# Patient Record
Sex: Female | Born: 1945 | Race: White | Hispanic: No | Marital: Married | Smoking: Never smoker
Health system: Southern US, Community
[De-identification: ages and names within clinical notes are randomized; demographics above are authoritative.]

## PROBLEM LIST (undated history)

## (undated) DIAGNOSIS — I2510 Atherosclerotic heart disease of native coronary artery without angina pectoris: Secondary | ICD-10-CM

## (undated) DIAGNOSIS — M1990 Unspecified osteoarthritis, unspecified site: Secondary | ICD-10-CM

## (undated) DIAGNOSIS — I10 Essential (primary) hypertension: Secondary | ICD-10-CM

## (undated) DIAGNOSIS — I350 Nonrheumatic aortic (valve) stenosis: Secondary | ICD-10-CM

## (undated) DIAGNOSIS — I4891 Unspecified atrial fibrillation: Secondary | ICD-10-CM

## (undated) DIAGNOSIS — I639 Cerebral infarction, unspecified: Secondary | ICD-10-CM

## (undated) DIAGNOSIS — I509 Heart failure, unspecified: Secondary | ICD-10-CM

## (undated) HISTORY — PX: ABDOMINAL HYSTERECTOMY: SHX81

## (undated) HISTORY — PX: CORONARY ARTERY BYPASS GRAFT: SHX141

## (undated) HISTORY — PX: JOINT REPLACEMENT: SHX530

## (undated) HISTORY — PX: BLADDER SURGERY: SHX569

## (undated) HISTORY — PX: CARDIAC CATHETERIZATION: SHX172

---

## 2006-08-30 HISTORY — PX: OTHER SURGICAL HISTORY: SHX169

## 2018-03-06 HISTORY — PX: OTHER SURGICAL HISTORY: SHX169

## 2018-03-20 DIAGNOSIS — Z5189 Encounter for other specified aftercare: Secondary | ICD-10-CM

## 2018-03-20 DIAGNOSIS — J9621 Acute and chronic respiratory failure with hypoxia: Secondary | ICD-10-CM

## 2018-03-20 DIAGNOSIS — I5022 Chronic systolic (congestive) heart failure: Secondary | ICD-10-CM | POA: Diagnosis not present

## 2018-03-20 DIAGNOSIS — I482 Chronic atrial fibrillation: Secondary | ICD-10-CM

## 2018-03-21 DIAGNOSIS — I482 Chronic atrial fibrillation: Secondary | ICD-10-CM

## 2018-03-21 DIAGNOSIS — Z5189 Encounter for other specified aftercare: Secondary | ICD-10-CM | POA: Diagnosis not present

## 2018-03-21 DIAGNOSIS — J9621 Acute and chronic respiratory failure with hypoxia: Secondary | ICD-10-CM

## 2018-03-21 DIAGNOSIS — I5022 Chronic systolic (congestive) heart failure: Secondary | ICD-10-CM | POA: Diagnosis not present

## 2018-03-22 DIAGNOSIS — Z5189 Encounter for other specified aftercare: Secondary | ICD-10-CM

## 2018-03-22 DIAGNOSIS — J9621 Acute and chronic respiratory failure with hypoxia: Secondary | ICD-10-CM

## 2018-03-22 DIAGNOSIS — I5022 Chronic systolic (congestive) heart failure: Secondary | ICD-10-CM

## 2018-03-22 DIAGNOSIS — I482 Chronic atrial fibrillation: Secondary | ICD-10-CM

## 2018-03-23 DIAGNOSIS — Z5189 Encounter for other specified aftercare: Secondary | ICD-10-CM | POA: Diagnosis not present

## 2018-03-23 DIAGNOSIS — I482 Chronic atrial fibrillation: Secondary | ICD-10-CM | POA: Diagnosis not present

## 2018-03-23 DIAGNOSIS — I5022 Chronic systolic (congestive) heart failure: Secondary | ICD-10-CM | POA: Diagnosis not present

## 2018-03-23 DIAGNOSIS — J9621 Acute and chronic respiratory failure with hypoxia: Secondary | ICD-10-CM | POA: Diagnosis not present

## 2018-03-24 DIAGNOSIS — I5022 Chronic systolic (congestive) heart failure: Secondary | ICD-10-CM

## 2018-03-24 DIAGNOSIS — J9621 Acute and chronic respiratory failure with hypoxia: Secondary | ICD-10-CM | POA: Diagnosis not present

## 2018-03-24 DIAGNOSIS — Z5189 Encounter for other specified aftercare: Secondary | ICD-10-CM

## 2018-03-24 DIAGNOSIS — I482 Chronic atrial fibrillation: Secondary | ICD-10-CM | POA: Diagnosis not present

## 2018-03-25 DIAGNOSIS — J9621 Acute and chronic respiratory failure with hypoxia: Secondary | ICD-10-CM

## 2018-03-25 DIAGNOSIS — Z5189 Encounter for other specified aftercare: Secondary | ICD-10-CM | POA: Diagnosis not present

## 2018-03-25 DIAGNOSIS — I5022 Chronic systolic (congestive) heart failure: Secondary | ICD-10-CM

## 2018-03-25 DIAGNOSIS — I482 Chronic atrial fibrillation: Secondary | ICD-10-CM

## 2018-03-26 DIAGNOSIS — Z5189 Encounter for other specified aftercare: Secondary | ICD-10-CM | POA: Diagnosis not present

## 2018-03-26 DIAGNOSIS — I5022 Chronic systolic (congestive) heart failure: Secondary | ICD-10-CM

## 2018-03-26 DIAGNOSIS — I482 Chronic atrial fibrillation: Secondary | ICD-10-CM | POA: Diagnosis not present

## 2018-03-26 DIAGNOSIS — J9621 Acute and chronic respiratory failure with hypoxia: Secondary | ICD-10-CM

## 2018-03-27 DIAGNOSIS — I5022 Chronic systolic (congestive) heart failure: Secondary | ICD-10-CM | POA: Diagnosis not present

## 2018-03-27 DIAGNOSIS — Z5189 Encounter for other specified aftercare: Secondary | ICD-10-CM

## 2018-03-27 DIAGNOSIS — I482 Chronic atrial fibrillation: Secondary | ICD-10-CM | POA: Diagnosis not present

## 2018-03-27 DIAGNOSIS — J9621 Acute and chronic respiratory failure with hypoxia: Secondary | ICD-10-CM

## 2018-03-28 DIAGNOSIS — I5022 Chronic systolic (congestive) heart failure: Secondary | ICD-10-CM | POA: Diagnosis not present

## 2018-03-28 DIAGNOSIS — Z5189 Encounter for other specified aftercare: Secondary | ICD-10-CM

## 2018-03-28 DIAGNOSIS — J9621 Acute and chronic respiratory failure with hypoxia: Secondary | ICD-10-CM | POA: Diagnosis not present

## 2018-03-28 DIAGNOSIS — I482 Chronic atrial fibrillation: Secondary | ICD-10-CM | POA: Diagnosis not present

## 2018-03-29 ENCOUNTER — Emergency Department (HOSPITAL_COMMUNITY): Payer: Medicare Other

## 2018-03-29 ENCOUNTER — Other Ambulatory Visit: Payer: Self-pay

## 2018-03-29 ENCOUNTER — Inpatient Hospital Stay (HOSPITAL_COMMUNITY)
Admission: EM | Admit: 2018-03-29 | Discharge: 2018-04-03 | DRG: 291 | Disposition: A | Discharge status: Other Acute Inpatient Hospital | Payer: Medicare Other | Source: Other Acute Inpatient Hospital | Attending: Internal Medicine | Admitting: Internal Medicine

## 2018-03-29 ENCOUNTER — Inpatient Hospital Stay (HOSPITAL_COMMUNITY): Payer: Medicare Other

## 2018-03-29 ENCOUNTER — Encounter (HOSPITAL_COMMUNITY): Payer: Self-pay | Admitting: *Deleted

## 2018-03-29 DIAGNOSIS — I509 Heart failure, unspecified: Secondary | ICD-10-CM | POA: Diagnosis not present

## 2018-03-29 DIAGNOSIS — I5020 Unspecified systolic (congestive) heart failure: Secondary | ICD-10-CM | POA: Diagnosis not present

## 2018-03-29 DIAGNOSIS — Z7901 Long term (current) use of anticoagulants: Secondary | ICD-10-CM

## 2018-03-29 DIAGNOSIS — L97228 Non-pressure chronic ulcer of left calf with other specified severity: Secondary | ICD-10-CM | POA: Diagnosis not present

## 2018-03-29 DIAGNOSIS — W19XXXD Unspecified fall, subsequent encounter: Secondary | ICD-10-CM | POA: Diagnosis present

## 2018-03-29 DIAGNOSIS — I350 Nonrheumatic aortic (valve) stenosis: Secondary | ICD-10-CM | POA: Diagnosis present

## 2018-03-29 DIAGNOSIS — J9601 Acute respiratory failure with hypoxia: Secondary | ICD-10-CM | POA: Diagnosis present

## 2018-03-29 DIAGNOSIS — T402X5A Adverse effect of other opioids, initial encounter: Secondary | ICD-10-CM | POA: Diagnosis not present

## 2018-03-29 DIAGNOSIS — I82443 Acute embolism and thrombosis of tibial vein, bilateral: Secondary | ICD-10-CM | POA: Diagnosis not present

## 2018-03-29 DIAGNOSIS — I4819 Other persistent atrial fibrillation: Secondary | ICD-10-CM

## 2018-03-29 DIAGNOSIS — Z6841 Body Mass Index (BMI) 40.0 and over, adult: Secondary | ICD-10-CM

## 2018-03-29 DIAGNOSIS — A4152 Sepsis due to Pseudomonas: Secondary | ICD-10-CM | POA: Diagnosis present

## 2018-03-29 DIAGNOSIS — I5043 Acute on chronic combined systolic (congestive) and diastolic (congestive) heart failure: Secondary | ICD-10-CM

## 2018-03-29 DIAGNOSIS — I255 Ischemic cardiomyopathy: Secondary | ICD-10-CM | POA: Diagnosis not present

## 2018-03-29 DIAGNOSIS — I248 Other forms of acute ischemic heart disease: Secondary | ICD-10-CM | POA: Diagnosis present

## 2018-03-29 DIAGNOSIS — I2510 Atherosclerotic heart disease of native coronary artery without angina pectoris: Secondary | ICD-10-CM | POA: Diagnosis present

## 2018-03-29 DIAGNOSIS — E8809 Other disorders of plasma-protein metabolism, not elsewhere classified: Secondary | ICD-10-CM | POA: Diagnosis present

## 2018-03-29 DIAGNOSIS — I2699 Other pulmonary embolism without acute cor pulmonale: Secondary | ICD-10-CM | POA: Diagnosis not present

## 2018-03-29 DIAGNOSIS — I824Z1 Acute embolism and thrombosis of unspecified deep veins of right distal lower extremity: Secondary | ICD-10-CM | POA: Diagnosis not present

## 2018-03-29 DIAGNOSIS — A419 Sepsis, unspecified organism: Secondary | ICD-10-CM

## 2018-03-29 DIAGNOSIS — R0603 Acute respiratory distress: Secondary | ICD-10-CM | POA: Diagnosis present

## 2018-03-29 DIAGNOSIS — M7989 Other specified soft tissue disorders: Secondary | ICD-10-CM | POA: Diagnosis not present

## 2018-03-29 DIAGNOSIS — I878 Other specified disorders of veins: Secondary | ICD-10-CM | POA: Diagnosis present

## 2018-03-29 DIAGNOSIS — I481 Persistent atrial fibrillation: Secondary | ICD-10-CM | POA: Diagnosis present

## 2018-03-29 DIAGNOSIS — G92 Toxic encephalopathy: Secondary | ICD-10-CM | POA: Diagnosis not present

## 2018-03-29 DIAGNOSIS — I110 Hypertensive heart disease with heart failure: Secondary | ICD-10-CM | POA: Diagnosis not present

## 2018-03-29 DIAGNOSIS — I4581 Long QT syndrome: Secondary | ICD-10-CM | POA: Diagnosis present

## 2018-03-29 DIAGNOSIS — R570 Cardiogenic shock: Secondary | ICD-10-CM

## 2018-03-29 DIAGNOSIS — D649 Anemia, unspecified: Secondary | ICD-10-CM | POA: Diagnosis not present

## 2018-03-29 DIAGNOSIS — I4891 Unspecified atrial fibrillation: Secondary | ICD-10-CM

## 2018-03-29 DIAGNOSIS — T45516A Underdosing of anticoagulants, initial encounter: Secondary | ICD-10-CM | POA: Diagnosis present

## 2018-03-29 DIAGNOSIS — I10 Essential (primary) hypertension: Secondary | ICD-10-CM

## 2018-03-29 DIAGNOSIS — I060 Rheumatic aortic stenosis: Secondary | ICD-10-CM | POA: Diagnosis not present

## 2018-03-29 DIAGNOSIS — I482 Chronic atrial fibrillation: Secondary | ICD-10-CM | POA: Diagnosis not present

## 2018-03-29 DIAGNOSIS — Z8673 Personal history of transient ischemic attack (TIA), and cerebral infarction without residual deficits: Secondary | ICD-10-CM

## 2018-03-29 DIAGNOSIS — S7291XD Unspecified fracture of right femur, subsequent encounter for closed fracture with routine healing: Secondary | ICD-10-CM

## 2018-03-29 DIAGNOSIS — I5041 Acute combined systolic (congestive) and diastolic (congestive) heart failure: Secondary | ICD-10-CM

## 2018-03-29 DIAGNOSIS — Z91128 Patient's intentional underdosing of medication regimen for other reason: Secondary | ICD-10-CM

## 2018-03-29 DIAGNOSIS — I361 Nonrheumatic tricuspid (valve) insufficiency: Secondary | ICD-10-CM | POA: Diagnosis not present

## 2018-03-29 DIAGNOSIS — M79609 Pain in unspecified limb: Secondary | ICD-10-CM | POA: Diagnosis not present

## 2018-03-29 DIAGNOSIS — Z5189 Encounter for other specified aftercare: Secondary | ICD-10-CM

## 2018-03-29 DIAGNOSIS — M9711XD Periprosthetic fracture around internal prosthetic right knee joint, subsequent encounter: Secondary | ICD-10-CM

## 2018-03-29 DIAGNOSIS — Z9889 Other specified postprocedural states: Secondary | ICD-10-CM

## 2018-03-29 DIAGNOSIS — Z951 Presence of aortocoronary bypass graft: Secondary | ICD-10-CM

## 2018-03-29 DIAGNOSIS — S7291XA Unspecified fracture of right femur, initial encounter for closed fracture: Secondary | ICD-10-CM

## 2018-03-29 DIAGNOSIS — Z96651 Presence of right artificial knee joint: Secondary | ICD-10-CM | POA: Diagnosis present

## 2018-03-29 DIAGNOSIS — I5022 Chronic systolic (congestive) heart failure: Secondary | ICD-10-CM | POA: Diagnosis not present

## 2018-03-29 DIAGNOSIS — Z79891 Long term (current) use of opiate analgesic: Secondary | ICD-10-CM

## 2018-03-29 DIAGNOSIS — Z7982 Long term (current) use of aspirin: Secondary | ICD-10-CM

## 2018-03-29 DIAGNOSIS — J9621 Acute and chronic respiratory failure with hypoxia: Secondary | ICD-10-CM

## 2018-03-29 DIAGNOSIS — E876 Hypokalemia: Secondary | ICD-10-CM | POA: Diagnosis not present

## 2018-03-29 DIAGNOSIS — Z79899 Other long term (current) drug therapy: Secondary | ICD-10-CM

## 2018-03-29 DIAGNOSIS — S82891D Other fracture of right lower leg, subsequent encounter for closed fracture with routine healing: Secondary | ICD-10-CM

## 2018-03-29 DIAGNOSIS — Z8781 Personal history of (healed) traumatic fracture: Secondary | ICD-10-CM

## 2018-03-29 DIAGNOSIS — Z955 Presence of coronary angioplasty implant and graft: Secondary | ICD-10-CM

## 2018-03-29 HISTORY — DX: Nonrheumatic aortic (valve) stenosis: I35.0

## 2018-03-29 HISTORY — DX: Unspecified osteoarthritis, unspecified site: M19.90

## 2018-03-29 HISTORY — DX: Cerebral infarction, unspecified: I63.9

## 2018-03-29 HISTORY — DX: Unspecified atrial fibrillation: I48.91

## 2018-03-29 HISTORY — DX: Heart failure, unspecified: I50.9

## 2018-03-29 HISTORY — DX: Atherosclerotic heart disease of native coronary artery without angina pectoris: I25.10

## 2018-03-29 HISTORY — DX: Essential (primary) hypertension: I10

## 2018-03-29 LAB — CBC WITH DIFFERENTIAL/PLATELET
BASOS PCT: 0 %
Basophils Absolute: 0 10*3/uL (ref 0.0–0.1)
EOS ABS: 0.2 10*3/uL (ref 0.0–0.7)
Eosinophils Relative: 5 %
HCT: 31.5 % — ABNORMAL LOW (ref 36.0–46.0)
HEMOGLOBIN: 9.6 g/dL — AB (ref 12.0–15.0)
Lymphocytes Relative: 17 %
Lymphs Abs: 0.8 10*3/uL (ref 0.7–4.0)
MCH: 30.1 pg (ref 26.0–34.0)
MCHC: 30.5 g/dL (ref 30.0–36.0)
MCV: 98.7 fL (ref 78.0–100.0)
Monocytes Absolute: 0.4 10*3/uL (ref 0.1–1.0)
Monocytes Relative: 9 %
NEUTROS PCT: 69 %
Neutro Abs: 3 10*3/uL (ref 1.7–7.7)
Platelets: 236 10*3/uL (ref 150–400)
RBC: 3.19 MIL/uL — AB (ref 3.87–5.11)
RDW: 20.8 % — ABNORMAL HIGH (ref 11.5–15.5)
WBC: 4.3 10*3/uL (ref 4.0–10.5)

## 2018-03-29 LAB — COMPREHENSIVE METABOLIC PANEL
ALK PHOS: 81 U/L (ref 38–126)
ALT: 13 U/L (ref 0–44)
ANION GAP: 10 (ref 5–15)
AST: 30 U/L (ref 15–41)
Albumin: 2.5 g/dL — ABNORMAL LOW (ref 3.5–5.0)
BILIRUBIN TOTAL: 2.2 mg/dL — AB (ref 0.3–1.2)
BUN: 18 mg/dL (ref 8–23)
CALCIUM: 9.7 mg/dL (ref 8.9–10.3)
CO2: 32 mmol/L (ref 22–32)
Chloride: 100 mmol/L (ref 98–111)
Creatinine, Ser: 0.56 mg/dL (ref 0.44–1.00)
GFR calc non Af Amer: >60 mL/min (ref >60)
Glucose, Bld: 139 mg/dL — ABNORMAL HIGH (ref 70–99)
POTASSIUM: 4 mmol/L (ref 3.5–5.1)
Sodium: 142 mmol/L (ref 135–145)
TOTAL PROTEIN: 7.5 g/dL (ref 6.5–8.1)

## 2018-03-29 LAB — BRAIN NATRIURETIC PEPTIDE: B Natriuretic Peptide: 417.1 pg/mL — ABNORMAL HIGH (ref 0.0–100.0)

## 2018-03-29 LAB — I-STAT TROPONIN, ED
TROPONIN I, POC: 0.14 ng/mL — AB (ref 0.00–0.08)
Troponin i, poc: 0.15 ng/mL (ref 0.00–0.08)

## 2018-03-29 LAB — MRSA PCR SCREENING: MRSA by PCR: NEGATIVE

## 2018-03-29 LAB — TSH: TSH: 5.807 u[IU]/mL — AB (ref 0.350–4.500)

## 2018-03-29 MED ORDER — MIDODRINE HCL 5 MG PO TABS
10.0000 mg | ORAL_TABLET | Freq: Three times a day (TID) | ORAL | Status: DC
  Administered 2018-03-30 – 2018-04-03 (×11): 10 mg via ORAL
  Filled 2018-03-29 (×14): qty 2

## 2018-03-29 MED ORDER — FUROSEMIDE 10 MG/ML IJ SOLN
60.0000 mg | Freq: Three times a day (TID) | INTRAMUSCULAR | Status: DC
  Administered 2018-03-29 – 2018-03-30 (×3): 60 mg via INTRAVENOUS
  Filled 2018-03-29 (×3): qty 6

## 2018-03-29 MED ORDER — SIMETHICONE 80 MG PO CHEW: 80.0000 mg | CHEWABLE_TABLET | Freq: Four times a day (QID) | ORAL | Status: DC | PRN

## 2018-03-29 MED ORDER — MORPHINE SULFATE (PF) 2 MG/ML IV SOLN
1.0000 mg | INTRAVENOUS | Status: DC | PRN
Start: 2018-03-29 — End: 2018-03-29
  Administered 2018-03-29: 1 mg via INTRAVENOUS

## 2018-03-29 MED ORDER — VITAMIN D3 25 MCG (1000 UNIT) PO TABS
1000.0000 [IU] | ORAL_TABLET | Freq: Every day | ORAL | Status: DC
  Administered 2018-03-30 – 2018-04-03 (×4): 1000 [IU] via ORAL
  Filled 2018-03-29 (×5): qty 1

## 2018-03-29 MED ORDER — APIXABAN 5 MG PO TABS
5.0000 mg | ORAL_TABLET | Freq: Two times a day (BID) | ORAL | Status: DC
  Administered 2018-03-29 – 2018-03-30 (×2): 5 mg via ORAL
  Filled 2018-03-29 (×2): qty 1

## 2018-03-29 MED ORDER — MORPHINE SULFATE (PF) 2 MG/ML IV SOLN
INTRAVENOUS | Status: AC
  Administered 2018-03-29: 1 mg via INTRAVENOUS
  Filled 2018-03-29: qty 1

## 2018-03-29 MED ORDER — OXYCODONE HCL ER 10 MG PO T12A
10.0000 mg | EXTENDED_RELEASE_TABLET | Freq: Two times a day (BID) | ORAL | Status: DC
  Administered 2018-03-29 – 2018-03-30 (×2): 10 mg via ORAL
  Filled 2018-03-29 (×2): qty 1

## 2018-03-29 MED ORDER — IOPAMIDOL (ISOVUE-370) INJECTION 76%
INTRAVENOUS | Status: AC
  Filled 2018-03-29: qty 100

## 2018-03-29 MED ORDER — POLYETHYLENE GLYCOL 3350 17 G PO PACK
17.0000 g | PACK | Freq: Every day | ORAL | Status: DC | PRN
Start: 2018-03-29 — End: 2018-04-03

## 2018-03-29 MED ORDER — AMIODARONE HCL IN DEXTROSE 360-4.14 MG/200ML-% IV SOLN
60.0000 mg/h | INTRAVENOUS | Status: AC
  Administered 2018-03-29: 60 mg/h via INTRAVENOUS

## 2018-03-29 MED ORDER — ACETAMINOPHEN 650 MG RE SUPP
650.0000 mg | Freq: Four times a day (QID) | RECTAL | Status: DC | PRN
  Administered 2018-03-30: 650 mg via RECTAL
  Filled 2018-03-29: qty 1

## 2018-03-29 MED ORDER — FUROSEMIDE 10 MG/ML IJ SOLN
60.0000 mg | Freq: Once | INTRAMUSCULAR | Status: AC
  Administered 2018-03-29: 60 mg via INTRAVENOUS
  Filled 2018-03-29: qty 8

## 2018-03-29 MED ORDER — CHLORHEXIDINE GLUCONATE 0.12 % MT SOLN
15.0000 mL | Freq: Two times a day (BID) | OROMUCOSAL | Status: DC
  Administered 2018-03-29 – 2018-04-03 (×10): 15 mL via OROMUCOSAL
  Filled 2018-03-29 (×9): qty 15

## 2018-03-29 MED ORDER — OXYCODONE HCL 5 MG PO TABS
5.0000 mg | ORAL_TABLET | Freq: Four times a day (QID) | ORAL | Status: DC | PRN
  Administered 2018-03-29 – 2018-04-03 (×10): 5 mg via ORAL
  Filled 2018-03-29 (×12): qty 1

## 2018-03-29 MED ORDER — ASPIRIN EC 81 MG PO TBEC
81.0000 mg | DELAYED_RELEASE_TABLET | Freq: Every day | ORAL | Status: DC
  Administered 2018-03-30: 81 mg via ORAL
  Filled 2018-03-29: qty 1

## 2018-03-29 MED ORDER — VITAMIN C 500 MG PO TABS
500.0000 mg | ORAL_TABLET | Freq: Every day | ORAL | Status: DC
  Administered 2018-03-30: 500 mg via ORAL
  Filled 2018-03-29: qty 1

## 2018-03-29 MED ORDER — MAGNESIUM OXIDE 400 (241.3 MG) MG PO TABS
400.0000 mg | ORAL_TABLET | Freq: Two times a day (BID) | ORAL | Status: DC
  Administered 2018-03-29 – 2018-04-03 (×8): 400 mg via ORAL
  Filled 2018-03-29 (×9): qty 1

## 2018-03-29 MED ORDER — AMIODARONE HCL IN DEXTROSE 360-4.14 MG/200ML-% IV SOLN
30.0000 mg/h | INTRAVENOUS | Status: DC
  Administered 2018-03-29 – 2018-03-30 (×3): 30 mg/h via INTRAVENOUS
  Filled 2018-03-29 (×4): qty 200

## 2018-03-29 MED ORDER — IOPAMIDOL (ISOVUE-370) INJECTION 76%
100.0000 mL | Freq: Once | INTRAVENOUS | Status: AC | PRN
  Administered 2018-03-29: 100 mL via INTRAVENOUS

## 2018-03-29 MED ORDER — GABAPENTIN 300 MG PO CAPS
300.0000 mg | ORAL_CAPSULE | Freq: Three times a day (TID) | ORAL | Status: DC
  Administered 2018-03-29 – 2018-03-30 (×2): 300 mg via ORAL
  Filled 2018-03-29 (×3): qty 1

## 2018-03-29 MED ORDER — ORAL CARE MOUTH RINSE
15.0000 mL | Freq: Two times a day (BID) | OROMUCOSAL | Status: DC
  Administered 2018-03-30 – 2018-04-03 (×7): 15 mL via OROMUCOSAL

## 2018-03-29 MED ORDER — AMIODARONE IV BOLUS ONLY 150 MG/100ML
150.0000 mg | Freq: Once | INTRAVENOUS | Status: AC
  Administered 2018-03-29: 150 mg via INTRAVENOUS
  Filled 2018-03-29: qty 100

## 2018-03-29 MED ORDER — ACETAMINOPHEN 325 MG PO TABS
650.0000 mg | ORAL_TABLET | Freq: Four times a day (QID) | ORAL | Status: DC | PRN
  Administered 2018-04-01 – 2018-04-03 (×2): 650 mg via ORAL
  Filled 2018-03-29 (×3): qty 2

## 2018-03-29 MED ORDER — METOPROLOL TARTRATE 25 MG PO TABS
12.5000 mg | ORAL_TABLET | Freq: Two times a day (BID) | ORAL | Status: DC
  Administered 2018-03-30 – 2018-04-03 (×7): 12.5 mg via ORAL
  Filled 2018-03-29 (×9): qty 1

## 2018-03-29 NOTE — Progress Notes (Signed)
Wound assessment  1)Rt. Ankle medial  Venous stasis ulcer 2x1x.1 ( foam dressing)  2)Lt lateral leg- weeping (Xeroform + foam dressing)  3)Healed ulcer-lt lateral ankle.  4) Exposed Rods & pins- Rt ankle and foot (medial & plantar surface)  5) Old healed rt knee incision  6)Rt hip-  Staples.

## 2018-03-29 NOTE — Progress Notes (Signed)
  Amiodarone Drug - Drug Interaction Consult Note  Recommendations: No significant drug- drug intxns noted at this time  Amiodarone is metabolized by the cytochrome P450 system and therefore has the potential to cause many drug interactions. Amiodarone has an average plasma half-life of 50 days (range 20 to 100 days).   There is potential for drug interactions to occur several weeks or months after stopping treatment and the onset of drug interactions may be slow after initiating amiodarone.   []  Statins: Increased risk of myopathy. Simvastatin- restrict dose to 20mg  daily. Other statins: counsel patients to report any muscle pain or weakness immediately.  []  Anticoagulants: Amiodarone can increase anticoagulant effect. Consider warfarin dose reduction. Patients should be monitored closely and the dose of anticoagulant altered accordingly, remembering that amiodarone levels take several weeks to stabilize.  []  Antiepileptics: Amiodarone can increase plasma concentration of phenytoin, the dose should be reduced. Note that small changes in phenytoin dose can result in large changes in levels. Monitor patient and counsel on signs of toxicity.  []  Beta blockers: increased risk of bradycardia, AV block and myocardial depression. Sotalol - avoid concomitant use.  []   Calcium channel blockers (diltiazem and verapamil): increased risk of bradycardia, AV block and myocardial depression.  []   Cyclosporine: Amiodarone increases levels of cyclosporine. Reduced dose of cyclosporine is recommended.  []  Digoxin dose should be halved when amiodarone is started.  []  Diuretics: increased risk of cardiotoxicity if hypokalemia occurs.  []  Oral hypoglycemic agents (glyburide, glipizide, glimepiride): increased risk of hypoglycemia. Patient's glucose levels should be monitored closely when initiating amiodarone therapy.   []  Drugs that prolong the QT interval:  Torsades de pointes risk may be increased with  concurrent use - avoid if possible.  Monitor QTc, also keep magnesium/potassium WNL if concurrent therapy can't be avoided. Marland Kitchen. Antibiotics: e.g. fluoroquinolones, erythromycin. . Antiarrhythmics: e.g. quinidine, procainamide, disopyramide, sotalol. . Antipsychotics: e.g. phenothiazines, haloperidol.  . Lithium, tricyclic antidepressants, and methadone.  Thank You,  Hannah Elliott, Hannah Elliott  03/29/2018 2:59 PM

## 2018-03-29 NOTE — ED Notes (Signed)
Bed: [id number] Expected date:  Expected time:  Means of arrival:  Comments: EMS/shob/a-fib.

## 2018-03-29 NOTE — H&P (Signed)
History and Physical    Hannah Elliott ZOX:[id number] DOB: 1945-11-19 DOA: 03/29/2018  PCP: Patient, No Pcp Per  Patient coming from: [facility]  I have personally briefly reviewed patient's old medical records in [facility]  Chief Complaint: shortness of breath  HPI: Hannah Elliott is Hannah Elliott 72 y.o. female with complex medical history significant for recent hospitalization for pseudomonas bacteremia at [facility] in addition to R femoral and ankle fx s/p ORIF, CAD s/p CABG, atrial fibrillation on eliquis, heart failure, chronic lower extremity wounds, and several other medical problems presenting with worsening shortness of breath due to suspected heart failure exacerbation as well as atrial fibrillation with RVR and possible PE.  Hannah Elliott was recently admitted to [facility] requiring ICU stay for pseudomonas bacteremia, afib with RVR and fall resulting in fx of R femur, distal tib/fib and 4th metatarsal.  She was ultimately discharged to [facility] after improvement.  She represents today with worsening shortness of breath.  She was being transferred to [facility] for Hannah Elliott follow up test for her leg (family not able to say exactly what test), but in transit, pt became SOB and [facility] called EMS who transported the patient to the emergency department.  She received albuterol en route.  She denies CP, fevers, chills, cough, abdominal pain.  She denied SOB at this time to me, but she's on 2 L [place] (she reports she's been on O2 since discharge to [facility]).  She notes RLE leg pain.      ED Course: Labs, CXR, CTA.  Cards c/s for afib with RVR.  Hospitalist to admit for HF exacerbation.  Review of Systems: As per HPI otherwise 10 point review of systems negative.   Past Medical History:  Diagnosis Date  . Aortic stenosis   . Arthritis   . Atrial fibrillation (HCC)   . CHF (congestive heart failure) (HCC)   . Coronary artery disease   . Hypertension   . Stroke [facility])     Past Surgical History:  Procedure  Laterality Date  . ABDOMINAL HYSTERECTOMY    . BLADDER SURGERY    . CARDIAC CATHETERIZATION    . CORONARY ARTERY BYPASS GRAFT    . JOINT REPLACEMENT     Right total knee replacement  . LAD Stent  2008  . ORIF of right femur and ankle  03/06/2018     reports that she has never smoked. She has never used smokeless tobacco. She reports that she does not drink alcohol or use drugs.  Allergies  Allergen Reactions  . Altace [Ramipril]     Listed on MAR  . Biaxin [Clarithromycin]     Listed on MAR  . Ciprofloxacin     Listed on MAR  . Latex     Listed on MAR  . Reopro [Abciximab]     Listed on MAR  . Simvastatin     Listed on MAR  . Tape     (adhesives) Listed on MAR    Family History  Problem Relation Age of Onset  . Cancer Mother   . COPD Mother   . Arthritis Mother   . Diabetes Father    Prior to Admission medications   Medication Sig Start Date End Date Taking? Authorizing Provider  acetaminophen (TYLENOL) 325 MG tablet Take 1,300 mg by mouth every 8 (eight) hours as needed for mild pain, fever or headache.   Yes [provider]  amiodarone (PACERONE) 200 MG tablet Take 200 mg by mouth daily.   Yes  [provider]  apixaban (ELIQUIS) 5 MG TABS tablet Take 5 mg by mouth 2 (two) times daily.   Yes [provider]  aspirin EC 81 MG tablet Take 81 mg by mouth daily.   Yes [provider]  cholecalciferol (VITAMIN D) 1000 units tablet Take 1,000 Units by mouth daily.   Yes [provider]  ENSURE PLUS (ENSURE PLUS) LIQD Take 237 mLs by mouth 3 (three) times daily with meals.   Yes [provider]  gabapentin (NEURONTIN) 300 MG capsule Take 300 mg by mouth 3 (three) times daily.   Yes [provider]  magnesium oxide (MAG-OX) 400 MG tablet Take 400 mg by mouth 2 (two) times daily.   Yes [provider]  midodrine (PROAMATINE) 10 MG tablet Take 10 mg by mouth 3 (three) times daily after meals.   Yes  [provider]  oxyCODONE (OXY IR/ROXICODONE) 5 MG immediate release tablet Take 5 mg by mouth every 4 (four) hours as needed for severe pain.   Yes [provider]  oxyCODONE (OXYCONTIN) 10 mg 12 hr tablet Take 10 mg by mouth every 12 (twelve) hours.   Yes [provider]  simethicone (MYLICON) 80 MG chewable tablet Chew 80 mg by mouth every 6 (six) hours as needed for flatulence.   Yes [provider]  torsemide (DEMADEX) 5 MG tablet Take 5 mg by mouth 3 (three) times Haldon Carley week.   Yes [provider]  vitamin C (ASCORBIC ACID) 500 MG tablet Take 500 mg by mouth daily.   Yes [provider]    Physical Exam: Vitals:   03/29/18 1600 03/29/18 1741 03/29/18 1820 03/29/18 1856  BP: (!) 118/49 (!) 113/57 140/72   Pulse:  (!) 111 84   Resp: 14 18 16    Temp:   98.8 F (37.1 C)   TempSrc:   Oral   SpO2:  94%    Weight:   65.4 kg (144 lb 2.9 oz) 121.4 kg (267 lb 10.2 oz)  Height:   5\' 10"  (1.778 m) 5\' 6"  (1.676 m)    Constitutional: NAD, calm, comfortable Vitals:   03/29/18 1600 03/29/18 1741 03/29/18 1820 03/29/18 1856  BP: (!) 118/49 (!) 113/57 140/72   Pulse:  (!) 111 84   Resp: 14 18 16    Temp:   98.8 F (37.1 C)   TempSrc:   Oral   SpO2:  94%    Weight:   65.4 kg (144 lb 2.9 oz) 121.4 kg (267 lb 10.2 oz)  Height:   5\' 10"  (1.778 m) 5\' 6"  (1.676 m)   Eyes: PERRL, lids and conjunctivae normal ENMT: Mucous membranes are moist. Posterior pharynx clear of any exudate or lesions.Normal dentition.  Neck: normal, supple, no masses, no thyromegaly Respiratory: clear to auscultation bilaterally, no wheezing, no crackles. Normal respiratory effort. No accessory muscle use.  Cardiovascular: Tachycardic  Irregular rate and rhythm, no murmurs / rubs / gallops. Bilateral LEE. + JVD Abdomen: no tenderness, no masses palpated. No hepatosplenomegaly. Bowel sounds positive.  Musculoskeletal: no clubbing / cyanosis. No joint deformity upper and  lower extremities. Good ROM, no contractures. Normal muscle tone.  Skin: Bilateral LE with chronic wounds.  RLE with staples to incisions, some erythema around incisions. Neurologic: CN 2-12 grossly intact. Sensation intact. Strength 5/5 in all 4.  Psychiatric: Normal judgment and insight. Alert and oriented x 3. Normal mood.   Labs on Admission: I have personally reviewed following labs and imaging studies  CBC:  Recent Labs  Lab 03/29/18 1110  WBC 4.3  NEUTROABS 3.0  HGB 9.6*  HCT 31.5*  MCV 98.7  PLT 236   Basic Metabolic Panel: Recent Labs  Lab 03/29/18 1110  NA 142  K 4.0  CL 100  CO2 32  GLUCOSE 139*  BUN 18  CREATININE 0.56  CALCIUM 9.7   GFR: Estimated Creatinine Clearance: 81.9 mL/min (by C-G formula based on SCr of 0.56 mg/dL). Liver Function Tests: Recent Labs  Lab 03/29/18 1110  AST 30  ALT 13  ALKPHOS 81  BILITOT 2.2*  PROT 7.5  ALBUMIN 2.5*   No results for input(s): LIPASE, AMYLASE in the last 168 hours. No results for input(s): AMMONIA in the last 168 hours. Coagulation Profile: No results for input(s): INR, PROTIME in the last 168 hours. Cardiac Enzymes: No results for input(s): CKTOTAL, CKMB, CKMBINDEX, TROPONINI in the last 168 hours. BNP (last 3 results) No results for input(s): PROBNP in the last 8760 hours. HbA1C: No results for input(s): HGBA1C in the last 72 hours. CBG: No results for input(s): GLUCAP in the last 168 hours. Lipid Profile: No results for input(s): CHOL, HDL, LDLCALC, TRIG, CHOLHDL, LDLDIRECT in the last 72 hours. Thyroid Function Tests: No results for input(s): TSH, T4TOTAL, FREET4, T3FREE, THYROIDAB in the last 72 hours. Anemia Panel: No results for input(s): VITAMINB12, FOLATE, FERRITIN, TIBC, IRON, RETICCTPCT in the last 72 hours. Urine analysis: No results found for: COLORURINE, APPEARANCEUR, LABSPEC, PHURINE, GLUCOSEU, HGBUR, BILIRUBINUR, KETONESUR, PROTEINUR, UROBILINOGEN, NITRITE, LEUKOCYTESUR  Radiological  Exams on Admission: Dg Chest 2 View  Result Date: 03/29/2018 CLINICAL DATA:  Shortness of breath. History of CHF, atrial fibrillation, coronary artery disease, and previous CVA. EXAM: CHEST - 2 VIEW COMPARISON:  None in PACs FINDINGS: The lungs are adequately inflated. The interstitial markings are increased. The cardiac silhouette is enlarged. The pulmonary vascularity is engorged. There are bilateral pleural effusions layering posteriorly. The patient has undergone previous CABG. The observed bony thorax is unremarkable. IMPRESSION: Findings likely reflect CHF. Bilateral pleural effusions layering posteriorly. One cannot exclude atelectasis or pneumonia in the left lower lobe. Previous CABG.  Thoracic aortic atherosclerosis. Electronically Signed   By: David  [place] M.D.   On: 03/29/2018 11:11   Dg Ankle Complete Right  Result Date: 03/29/2018 CLINICAL DATA:  Ankle fracture EXAM: RIGHT ANKLE - COMPLETE 3+ VIEW COMPARISON:  None. FINDINGS: No prior radiographs available for comparison. Screw fixation of the distal fibula. Fixating pins across medial malleolar fracture without significant displacement. Long presumed metallic fixating device extending from plantar surface of the foot to the distal shaft of the tibia. Suspect that there may be Bebe Moncure posterior malleolar fracture. IMPRESSION: Surgically fixated fractures involving the distal fibula and tibia. Hardware appears intact. Alignment is near anatomic. Electronically Signed   By: Jasmine Pang M.D.   On: 03/29/2018 17:18   Ct Angio Chest Pe W And/or Wo Contrast  Result Date: 03/29/2018 CLINICAL DATA:  Acute shortness of breath. EXAM: CT ANGIOGRAPHY CHEST WITH CONTRAST TECHNIQUE: Multidetector CT imaging of the chest was performed using the standard protocol during bolus administration of intravenous contrast. Multiplanar CT image reconstructions and MIPs were obtained to evaluate the vascular anatomy. CONTRAST:  ISOVUE-370 IOPAMIDOL (ISOVUE-370)  INJECTION 76% COMPARISON:  Chest radiograph 03/29/2018 FINDINGS: Cardiovascular: Suboptimal evaluation of the segmental pulmonary arteries. No evidence of central hemodynamically significant pulmonary embolus. Possible nonocclusive small pulmonary embolus in Alisse Tuite segmental branch of the right upper lobe. Enlarged heart. Marked calcific atherosclerotic disease of the coronary  arteries. Post hemorrhage. Mitral and aortic valve annular calcifications. Mediastinum/Nodes: No enlarged mediastinal, hilar, or axillary lymph nodes. Thyroid gland, trachea, and esophagus demonstrate no significant findings. Lungs/Pleura: Mixed alveolar and interstitial opacities bilaterally with dependent predominance. Bilateral small to moderate pleural effusions. Bilateral lower lobe subsegmental atelectasis. Upper Abdomen: No acute abnormality. Musculoskeletal: Spondylosis of the thoracic spine. Review of the MIP images confirms the above findings. IMPRESSION: Suboptimal evaluation of the segmental pulmonary arteries. No evidence of central hemodynamically significant pulmonary embolus. Possible nonocclusive small pulmonary embolus in Felicita Nuncio segmental branch of the right upper lobe. Cardiomegaly, mixed pattern pulmonary edema with small to moderate bilateral pleural effusions and bibasilar subsegmental atelectasis. Aortic Atherosclerosis (ICD10-I70.0). Electronically Signed   By: Ted Mcalpine M.D.   On: 03/29/2018 14:06   Dg Foot Complete Right  Result Date: 03/29/2018 CLINICAL DATA:  Fractures EXAM: RIGHT FOOT COMPLETE - 3+ VIEW COMPARISON:  None. FINDINGS: No prior radiographs available for comparison. Osteopenia limits the exam. There appear to be mildly displaced fractures at the neck of the second proximal phalanx, head of the third proximal phalanx, and neck of the fourth proximal phalanx. Possible nondisplaced fracture neck of the fifth proximal phalanx. Dorsiflexion at the MCP joints. Possible dorsal subluxation of the base of  either the fourth or fifth proximal phalanx with respect to the head of the metacarpal. IMPRESSION: 1. Osteopenia limits the exam. Suspected mildly displaced fractures involving the distal aspects of the second through fifth proximal phalanges. Appearance of possible dorsal subluxation of either the fourth or fifth proximal phalanx with respect to the head of the metatarsal. Electronically Signed   By: Jasmine Pang M.D.   On: 03/29/2018 17:26   Dg Femur, Min 2 Views Right  Result Date: 03/29/2018 CLINICAL DATA:  Femur fracture EXAM: RIGHT FEMUR 2 VIEWS COMPARISON:  None. FINDINGS: No prior radiographs available for comparison. Cutaneous staples within the right thigh. Patient is status post surgical plate and multiple screw fixation of the proximal to distal femur. Status post knee replacement. Comminuted and slightly impacted distal femoral fracture adjacent to the knee prosthesis. Vascular calcifications. Diffuse soft tissue edema. IMPRESSION: 1. Status post knee replacement. Surgical plate and screw fixation of the right femur with impacted and slightly comminuted distal femoral fracture slightly cephalad to the knee replacement. No significant bridging callus at this time. Electronically Signed   By: Jasmine Pang M.D.   On: 03/29/2018 17:14    EKG: Independently reviewed. No priors for comparison.  Atrial fibrillation.  Tachycardic.T wave flattening.  Prolonged QT.    Assessment/Plan Principal Problem:   Heart failure (HCC)  Acute Hypoxic Respiratory Failure  Shortness of Breath:  2/2 to HF exacerbation, afib with RVR, as well as possible pulmonary embolism.  Pt and husband note she's been on O2 since being discharged from [facility] to [facility]. Treating as noted below Wean O2 as tolerated   Heart Failure Exacerbation: per care everywhere note, pt with EF 45-50%, mild R to L interatrial shunt.  Not able to see echo report, will repeat this here. Looks like torsemide was held at discharge, not  clear if she was getting this at [facility], though per med rec, looks like it was three times weekly dosing. Strict I/O, daily weights Lasix ordered 60 TID per cards Metoprolol added per cards Follow echocardiogram  Atrial fibrillation with RVR: chadvasc at least 6.  Continue eliquis.  Amiodarone per cards.  Metoprolol added per cards. Consider digoxin as well Cards c/s, appreciate recs  Hypotension: continue midodrine, BP's  appropriate  Possible Pulmonary Embolism:  CT with "possible nonocclusive small PE in segmental branch of RUL".  Will follow echo and LE US.  Continue eliquis (of note, pt notes when she was home she was taking this daily instead of twice daily, discussed importance of taking as prescribed).  CAD s/p CABG  Elevated Troponin: elevated troponin likely demand ischemia.  Follow.  Continue ASA, beta blocker.  Not on statin.    Right femoral and ankle fracture: [facility].  Staples still in place.  Will c/s orthopedics to evaluate wound, some redness to incision site. Needs orthopedic follow up Oxycodone/oxycontin for pain  Lower Extremity Wounds: wound c/s.  Looks like non emergent CTA abdomen/pelvis with runoff was planned to further evaluate vascular disease at some point.       Anemia: follow repeat CBC in AM  Elevated Bili: follow repeat in AM  Prolonged QTc: follow repeat EKG in AM, follow mag, K.  Limit QT prolonging meds as possible  DVT prophylaxis: eliquis Code Status: full, discussed with pt and husband Family Communication: husband at bedside  Disposition Plan: pending improvement  Consults called: cardiology, orthopedics  Admission status: inpatient, stepdown    Lacretia Nicksaldwell Powell MD [facility] Pager [phone]  If 7PM-7AM, please contact night-coverage [email or web address] Password [email or web address]  03/29/2018, 7:05 PM

## 2018-03-29 NOTE — ED Notes (Signed)
ED TO INPATIENT HANDOFF REPORT  Name/Age/Gender Hannah Elliott 72 y.o. female  Code Status   Home/SNF/Other Skilled nursing facility  Chief Complaint SOB   Level of Care/Admitting Diagnosis ED Disposition    ED Disposition Condition [place]: [place] [100102]  Level of Care: Stepdown [14]  Admit to SDU based on following criteria: Hemodynamic compromise or significant risk of instability:  Patient requiring short term acute titration and management of vasoactive drips, and invasive monitoring (i.e., CVP and Arterial line).  Diagnosis: Heart failure [facility]) [[id number]]  Admitting Physician: Elodia Florence [phone]  Attending Physician: Cephus Slater, A CALDWELL [phone]  Estimated length of stay: past midnight tomorrow  Certification:: I certify this patient will need inpatient services for at least 2 midnights  PT Class (Do Not Modify): Inpatient [101]  PT Acc Code (Do Not Modify): Private [1]       Medical History Past Medical History:  Diagnosis Date  . Aortic stenosis   . Arthritis   . Atrial fibrillation ([place])   . CHF (congestive heart failure) ([place])   . Coronary artery disease   . Hypertension   . Stroke [facility])     Allergies Allergies  Allergen Reactions  . Altace [Ramipril]     Listed on MAR  . Biaxin [Clarithromycin]     Listed on MAR  . Ciprofloxacin     Listed on MAR  . Latex     Listed on MAR  . Reopro [Abciximab]     Listed on MAR  . Simvastatin     Listed on MAR  . Tape     (adhesives) Listed on MAR    IV Location/Drains/Wounds Patient Lines/Drains/Airways Status   Active Line/Drains/Airways    Name:   Placement date:   Placement time:   Site:   Days:   Peripheral IV 03/29/18 Right Forearm   03/29/18    0000    Forearm   less than 1          Labs/Imaging Results for orders placed or performed during the hospital encounter of 03/29/18 (from the past 48 hour(s))  Comprehensive metabolic  panel     Status: Abnormal   Collection Time: 03/29/18 11:10 AM  Result Value Ref Range   Sodium 142 135 - 145 mmol/L   Potassium 4.0 3.5 - 5.1 mmol/L   Chloride 100 98 - 111 mmol/L   CO2 32 22 - 32 mmol/L   Glucose, Bld 139 (H) 70 - 99 mg/dL   BUN 18 8 - 23 mg/dL   Creatinine, Ser 0.56 0.44 - 1.00 mg/dL   Calcium 9.7 8.9 - 10.3 mg/dL   Total Protein 7.5 6.5 - 8.1 g/dL   Albumin 2.5 (L) 3.5 - 5.0 g/dL   AST 30 15 - 41 U/L   ALT 13 0 - 44 U/L   Alkaline Phosphatase 81 38 - 126 U/L   Total Bilirubin 2.2 (H) 0.3 - 1.2 mg/dL   GFR calc non Af Amer >60 >60 mL/min   GFR calc Af Amer >60 >60 mL/min    Comment: (NOTE) The eGFR has been calculated using the CKD EPI equation. This calculation has not been validated in all clinical situations. eGFR's persistently <60 mL/min signify possible Chronic Kidney Disease.    Anion gap 10 5 - 15    Comment: Performed at [facility], [place] [address] [place], [place] [address]  Brain natriuretic peptide     Status:  Abnormal   Collection Time: 03/29/18 11:10 AM  Result Value Ref Range   B Natriuretic Peptide 417.1 (H) 0.0 - 100.0 pg/mL    Comment: Performed at [facility], [place] [address], [place], [place] [address]  CBC with Differential     Status: Abnormal   Collection Time: 03/29/18 11:10 AM  Result Value Ref Range   WBC 4.3 4.0 - 10.5 K/uL   RBC 3.19 (L) 3.87 - 5.11 MIL/uL   Hemoglobin 9.6 (L) 12.0 - 15.0 g/dL   HCT 31.5 (L) 36.0 - 46.0 %   MCV 98.7 78.0 - 100.0 fL   MCH 30.1 26.0 - 34.0 pg   MCHC 30.5 30.0 - 36.0 g/dL   RDW 20.8 (H) 11.5 - 15.5 %   Platelets 236 150 - 400 K/uL   Neutrophils Relative % 69 %   Neutro Abs 3.0 1.7 - 7.7 K/uL   Lymphocytes Relative 17 %   Lymphs Abs 0.8 0.7 - 4.0 K/uL   Monocytes Relative 9 %   Monocytes Absolute 0.4 0.1 - 1.0 K/uL   Eosinophils Relative 5 %   Eosinophils Absolute 0.2 0.0 - 0.7 K/uL   Basophils Relative 0 %   Basophils Absolute 0.0 0.0 - 0.1 K/uL     Comment: Performed at [facility], [place] [address], [place], [place] [address]  I-stat troponin, ED     Status: Abnormal   Collection Time: 03/29/18 11:14 AM  Result Value Ref Range   Troponin i, poc 0.15 (HH) 0.00 - 0.08 ng/mL   Comment NOTIFIED PHYSICIAN    Comment 3            Comment: Due to the release kinetics of cTnI, a negative result within the first hours of the onset of symptoms does not rule out myocardial infarction with certainty. If myocardial infarction is still suspected, repeat the test at appropriate intervals.   I-stat troponin, ED     Status: Abnormal   Collection Time: 03/29/18  3:01 PM  Result Value Ref Range   Troponin i, poc 0.14 (HH) 0.00 - 0.08 ng/mL   Comment NOTIFIED PHYSICIAN    Comment 3            Comment: Due to the release kinetics of cTnI, a negative result within the first hours of the onset of symptoms does not rule out myocardial infarction with certainty. If myocardial infarction is still suspected, repeat the test at appropriate intervals.    Dg Chest 2 View  Result Date: 03/29/2018 CLINICAL DATA:  Shortness of breath. History of CHF, atrial fibrillation, coronary artery disease, and previous CVA. EXAM: CHEST - 2 VIEW COMPARISON:  None in PACs FINDINGS: The lungs are adequately inflated. The interstitial markings are increased. The cardiac silhouette is enlarged. The pulmonary vascularity is engorged. There are bilateral pleural effusions layering posteriorly. The patient has undergone previous CABG. The observed bony thorax is unremarkable. IMPRESSION: Findings likely reflect CHF. Bilateral pleural effusions layering posteriorly. One cannot exclude atelectasis or pneumonia in the left lower lobe. Previous CABG.  Thoracic aortic atherosclerosis. Electronically Signed   By: David  Martinique M.D.   On: 03/29/2018 11:11   Ct Angio Chest Pe W And/or Wo Contrast  Result Date: 03/29/2018 CLINICAL DATA:  Acute shortness of breath.  EXAM: CT ANGIOGRAPHY CHEST WITH CONTRAST TECHNIQUE: Multidetector CT imaging of the chest was performed using the standard protocol during bolus administration of intravenous contrast. Multiplanar CT image reconstructions and MIPs were obtained to evaluate the  vascular anatomy. CONTRAST:  171m ISOVUE-370 IOPAMIDOL (ISOVUE-370) INJECTION 76% COMPARISON:  Chest radiograph 03/29/2018 FINDINGS: Cardiovascular: Suboptimal evaluation of the segmental pulmonary arteries. No evidence of central hemodynamically significant pulmonary embolus. Possible nonocclusive small pulmonary embolus in a segmental branch of the right upper lobe. Enlarged heart. Marked calcific atherosclerotic disease of the coronary arteries. Post hemorrhage. Mitral and aortic valve annular calcifications. Mediastinum/Nodes: No enlarged mediastinal, hilar, or axillary lymph nodes. Thyroid gland, trachea, and esophagus demonstrate no significant findings. Lungs/Pleura: Mixed alveolar and interstitial opacities bilaterally with dependent predominance. Bilateral small to moderate pleural effusions. Bilateral lower lobe subsegmental atelectasis. Upper Abdomen: No acute abnormality. Musculoskeletal: Spondylosis of the thoracic spine. Review of the MIP images confirms the above findings. IMPRESSION: Suboptimal evaluation of the segmental pulmonary arteries. No evidence of central hemodynamically significant pulmonary embolus. Possible nonocclusive small pulmonary embolus in a segmental branch of the right upper lobe. Cardiomegaly, mixed pattern pulmonary edema with small to moderate bilateral pleural effusions and bibasilar subsegmental atelectasis. Aortic Atherosclerosis (ICD10-I70.0). Electronically Signed   By: DFidela SalisburyM.D.   On: 03/29/2018 14:06    Pending Labs [employer](From admission, onward)   Start     Ordered   Signed and Held  Comprehensive metabolic panel  Tomorrow morning,   R     Signed and Held   Signed and Held  CBC   Tomorrow morning,   R     Signed and Held   Signed and Held  Magnesium  Tomorrow morning,   R     Signed and Held      Vitals/Pain Today's Vitals   03/29/18 1200 03/29/18 1230 03/29/18 1300 03/29/18 1330  BP: (!) 125/52 (!) 123/57 122/66 114/61  Pulse: (!) 114 (!) 121 (!) 126 (!) 117  Resp: _0 Temp:      TempSrc:      SpO2: 97% 99% 100% 99%  PainSc:        Isolation Precautions No active isolations  Medications Medications  iopamidol (ISOVUE-370) 76 % injection (has no administration in time range)  amiodarone (NEXTERONE PREMIX) 360-4.14 MG/200ML-% (1.8 mg/mL) IV infusion (60 mg/hr Intravenous New Bag/Given 03/29/18 1539)    Followed by  amiodarone (NEXTERONE PREMIX) 360-4.14 MG/200ML-% (1.8 mg/mL) IV infusion (has no administration in time range)  morphine 2 MG/ML injection 1 mg (1 mg Intravenous Given 03/29/18 1530)  furosemide (LASIX) injection 60 mg (has no administration in time range)  metoprolol tartrate (LOPRESSOR) tablet 12.5 mg (has no administration in time range)  furosemide (LASIX) injection 60 mg (60 mg Intravenous Given 03/29/18 1221)  iopamidol (ISOVUE-370) 76 % injection 100 mL (100 mLs Intravenous Contrast Given 03/29/18 1340)  amiodarone (NEXTERONE) IV bolus only 150 mg/100 mL (0 mg Intravenous Stopped 03/29/18 1507)    Mobility non-ambulatory

## 2018-03-29 NOTE — ED Triage Notes (Signed)
Per EMS, pt was being transported by [facility] to a doctor appointment when she became short of breath. [facility] administered 5mg  albuterol and called EMS for transport to ED. [facility] reports O2 saturation was in upper 80s.   BP 132/86 HR 104 RR 20 CBG 140

## 2018-03-29 NOTE — Consult Note (Signed)
Cardiology Consultation:   Patient ID: LABREA ECCLESTON; [id number]; Dec 25, 1945   Admit date: 03/29/2018 Date of Consult: 03/29/2018  Primary Care Provider: Patient, No Pcp Per Primary Cardiologist: [facility], new to Dr. Duke Salvia Primary Electrophysiologist:     Patient Profile:   Hannah Elliott is a 72 y.o. female with a hx of atrial fibrillation, s/p ablation of aflutter on chronic eliquis, CAD s/p CABG 2008 (LIMA-LAD, SVG to OM), ischemic cardiomyopathy with EF of 45-50% , most recent cath with patent grafts (2010), lymphedema and lower extremity ulcers due to venous stasis who is being seen today for the evaluation of atrial fibrillation with RVR and acute on chronic systolic and diastolic heart failure at the request of Dr. Jacqulyn Bath.  History of Present Illness:   Hannah Elliott follows with [facility] and was recently seen on 12/15/17. At that visit, she was doing well on current medications. It was noted that her last echo 11/2017 revealed LVEF 45-50%.  She had a recent hospitalization at [facility] after a fall and fracture of the R femur.  That hospitalization was complicated by pseudomonas bacteremia s/p 10 days of zosyn. Hospital course was complicated by respiratory failure and shock. She was also found to be in Afib RVR and started on anticoagulation and amiodarone. She remained hypotensive and was discharged on midodrine.  Her home metoprolol and torsemide were held.  She was ultimately discharged to [facility] 3 weeks ago.   This morning, she was being transported to a doctor's appt and developed SOB in route. EMS was called and transported to [facility]. She received albuterol in route. On arrival, CXR with pulmonary edema and bilateral pleural effusions; question infiltrate, but patient denies symptoms of PNA; ABX not started. She also reports increased lower extremity swelling.   Troponin mildly elevated to 0.15. BNP is elevated to 417.1 with a normal creatinine. EKG with Afib RVR,  ventricular rate 129. CTA was negative for PE. She was started on amiodarone drip for RVR and has received one dose of lasix 60 mg IV. Cardiology was asked to consult.  She denies chest pain, shortness of breath, orthopnea or PND.  She continues to have LE edema is better than during her hospitalization.  Past Medical History:  Diagnosis Date  . Aortic stenosis   . Arthritis   . Atrial fibrillation ([facility])   . CHF (congestive heart failure) ([facility])   . Coronary artery disease   . Hypertension   . Stroke [facility])     Past Surgical History:  Procedure Laterality Date  . ABDOMINAL HYSTERECTOMY    . BLADDER SURGERY    . CARDIAC CATHETERIZATION    . CORONARY ARTERY BYPASS GRAFT    . JOINT REPLACEMENT     Right total knee replacement  . LAD Stent  2008  . ORIF of right femur and ankle  03/06/2018     Home Medications:  Prior to Admission medications   Medication Sig Start Date End Date Taking? Authorizing Provider  acetaminophen (TYLENOL) 325 MG tablet Take 1,300 mg by mouth every 8 (eight) hours as needed for mild pain, fever or headache.   Yes [provider]  amiodarone (PACERONE) 200 MG tablet Take 200 mg by mouth daily.   Yes [provider]  apixaban (ELIQUIS) 5 MG TABS tablet Take 5 mg by mouth 2 (two) times daily.   Yes [provider]  aspirin EC 81 MG tablet Take 81 mg by mouth daily.   Yes [provider]  cholecalciferol (VITAMIN  D) 1000 units tablet Take 1,000 Units by mouth daily.   Yes [provider]  ENSURE PLUS (ENSURE PLUS) LIQD Take 237 mLs by mouth 3 (three) times daily with meals.   Yes [provider]  gabapentin (NEURONTIN) 300 MG capsule Take 300 mg by mouth 3 (three) times daily.   Yes [provider]  magnesium oxide (MAG-OX) 400 MG tablet Take 400 mg by mouth 2 (two) times daily.   Yes [provider]  midodrine (PROAMATINE) 10 MG tablet Take 10 mg by mouth 3 (three) times daily after meals.    Yes [provider]  oxyCODONE (OXY IR/ROXICODONE) 5 MG immediate release tablet Take 5 mg by mouth every 4 (four) hours as needed for severe pain.   Yes [provider]  oxyCODONE (OXYCONTIN) 10 mg 12 hr tablet Take 10 mg by mouth every 12 (twelve) hours.   Yes [provider]  simethicone (MYLICON) 80 MG chewable tablet Chew 80 mg by mouth every 6 (six) hours as needed for flatulence.   Yes [provider]  torsemide (DEMADEX) 5 MG tablet Take 5 mg by mouth 3 (three) times a week.   Yes [provider]  vitamin C (ASCORBIC ACID) 500 MG tablet Take 500 mg by mouth daily.   Yes [provider]    Inpatient Medications: Scheduled Meds: . iopamidol       Continuous Infusions:  PRN Meds:   Allergies:    Allergies  Allergen Reactions  . Altace [Ramipril]     Listed on MAR  . Biaxin [Clarithromycin]     Listed on MAR  . Ciprofloxacin     Listed on MAR  . Latex     Listed on MAR  . Reopro [Abciximab]     Listed on MAR  . Simvastatin     Listed on MAR  . Tape     (adhesives) Listed on MAR    Social History:   Social History   Socioeconomic History  . Marital status: Married    Spouse name: Not on file  . Number of children: Not on file  . Years of education: Not on file  . Highest education level: Not on file  Occupational History  . Not on file  Social Needs  . Financial resource strain: Not on file  . Food insecurity:    Worry: Not on file    Inability: Not on file  . Transportation needs:    Medical: Not on file    Non-medical: Not on file  Tobacco Use  . Smoking status: Never Smoker  . Smokeless tobacco: Never Used  Substance and Sexual Activity  . Alcohol use: Never    Frequency: Never  . Drug use: Never  . Sexual activity: Not Currently  Lifestyle  . Physical activity:    Days per week: Not on file    Minutes per session: Not on file  . Stress: Not on file  Relationships  . Social connections:      Talks on phone: Not on file    Gets together: Not on file    Attends religious service: Not on file    Active member of club or organization: Not on file    Attends meetings of clubs or organizations: Not on file    Relationship status: Not on file  . Intimate partner violence:    Fear of current or ex partner: Not on file    Emotionally abused: Not on file  Physically abused: Not on file    Forced sexual activity: Not on file  Other Topics Concern  . Not on file  Social History Narrative  . Not on file    Family History:    Family History  Problem Relation Age of Onset  . Cancer Mother   . COPD Mother   . Arthritis Mother   . Diabetes Father      ROS:  Please see the history of present illness.   All other ROS reviewed and negative.     Physical Exam/Data:   Vitals:   03/29/18 1026 03/29/18 1130 03/29/18 1200  BP: (!) 143/69 (!) 120/53 (!) 125/52  Pulse: (!) 130 (!) 126 (!) 114  Resp: 20 18 16   Temp: 98.1 F (36.7 C)    TempSrc: Oral    SpO2: 100% 99% 97%   VS:  BP 114/61   Pulse (!) 117   Temp 98.1 F (36.7 C) (Oral)   Resp 14   SpO2 99%  , BMI There is no height or weight on file to calculate BMI. GENERAL: Chronically ill-appearing.  Sleepy but arouses to questions.  Morbidly obese. HEENT: Pupils equal round and reactive, fundi not visualized, oral mucosa unremarkable NECK:  No jugular venous distention, waveform within normal limits, carotid upstroke brisk and symmetric, no bruits LUNGS:  Clear to auscultation bilaterally on anterior exam.  Patient unable to lean forward to listen posteriorly. HEART: Tachycardic.  Irregularly irregular.  PMI not displaced or sustained,S1 and S2 within normal limits, no S3, no S4, no clicks, no rubs, II/VI systolic murmur at the LUSB ABD:  Flat, positive bowel sounds normal in frequency in pitch, no bruits, no rebound, no guarding, no midline pulsatile mass, no hepatomegaly, no splenomegaly EXT:  2 plus pulses  throughout, 2+ pitting edema to above the thighs bilaterally, no cyanosis no clubbing SKIN:  No rashes no nodules NEURO:  Cranial nerves II through XII grossly intact, motor grossly intact throughout PSYCH:  Cognitively intact, oriented to person place and time   EKG:  The EKG was personally reviewed and demonstrates:  Afib with ventricular rate 129 Telemetry:  Telemetry was personally reviewed and demonstrates:  Atrial fibrillation.  Rate 110s.  PVCs  Relevant CV Studies:  Echo 2018 at [facility]  Laboratory Data:  Chemistry Recent Labs  Lab 03/29/18 1110  NA 142  K 4.0  CL 100  CO2 32  GLUCOSE 139*  BUN 18  CREATININE 0.56  CALCIUM 9.7  GFRNONAA >60  GFRAA >60  ANIONGAP 10    Recent Labs  Lab 03/29/18 1110  PROT 7.5  ALBUMIN 2.5*  AST 30  ALT 13  ALKPHOS 81  BILITOT 2.2*   Hematology Recent Labs  Lab 03/29/18 1110  WBC 4.3  RBC 3.19*  HGB 9.6*  HCT 31.5*  MCV 98.7  MCH 30.1  MCHC 30.5  RDW 20.8*  PLT 236   Cardiac EnzymesNo results for input(s): TROPONINI in the last 168 hours.  Recent Labs  Lab 03/29/18 1114  TROPIPOC 0.15*    BNP Recent Labs  Lab 03/29/18 1110  BNP 417.1*    DDimer No results for input(s): DDIMER in the last 168 hours.  Radiology/Studies:  Dg Chest 2 View  Result Date: 03/29/2018 CLINICAL DATA:  Shortness of breath. History of CHF, atrial fibrillation, coronary artery disease, and previous CVA. EXAM: CHEST - 2 VIEW COMPARISON:  None in PACs FINDINGS: The lungs are adequately inflated. The interstitial markings are increased. The cardiac silhouette is  enlarged. The pulmonary vascularity is engorged. There are bilateral pleural effusions layering posteriorly. The patient has undergone previous CABG. The observed bony thorax is unremarkable. IMPRESSION: Findings likely reflect CHF. Bilateral pleural effusions layering posteriorly. One cannot exclude atelectasis or pneumonia in the left lower lobe. Previous CABG.  Thoracic aortic  atherosclerosis. Electronically Signed   By: David  [place] M.D.   On: 03/29/2018 11:11    Assessment and Plan:   1. Atrial fibrillation with RVR - continue eliquis  - amiodarone bolus and drip started for rate control - If her BP tolerates we will add low dose metoprolol.  Otherwise consider digoxin.  2. Acute on chronic systolic heart failure: Likely in the setting of holding torsemide since discharge from hospital. CXR with pulmonary edema and BNP is elevated.    3. CAD s/p CABG: Elevated troponin likely secondary to demand ischemia in the setting of acute on chronic CHF.  Patient denies chest pain so no ischemic work up at this time.   4. Pulmonary embolism: Chest CT with possible subsegmental PE.  Likely in the setting of leg fracture and surgery while anticoagulation was held.  Continue Eliquis.      For questions or updates, please contact [facility] Please consult [email or web address] for contact info under Cardiology/STEMI.   Signed, Hallelujah Wysong C. Duke Salvia, MD, [facility]  03/29/2018 3:16 PM

## 2018-03-29 NOTE — ED Triage Notes (Signed)
At the time of her arrival, [facility] to request that when pt. Is medically cleared, that she be transferred back to their room number 327.

## 2018-03-29 NOTE — Progress Notes (Signed)
Patient ID: Hannah Elliott, female   DOB: 07-10-1946, 72 y.o.   MRN: [id number]  Dickie Laoby is being admitted with ARF, currently unknown etiology. [facility] ORIF of right periprosthetic distal femur fx, right ankle fx, and right foot fxs on 7/8. Her lateral knee incision is well healed, erythematous, no discharge, no fluctuance. Will d/c staples. The right foot has perc pins protruding from the medial foot and plantar surface. Will obtain x-rays, full consult to follow tomorrow AM. Dr. Eulah PontMurphy will be following while inpatient. She should f/u with [facility] at discharge. NWB RLE.    Freeman CaldronMichael J. Alya Smaltz, PA-C Orthopedic Surgery [phone]

## 2018-03-29 NOTE — ED Provider Notes (Signed)
Emergency Department Provider Note   I have reviewed the triage vital signs and the nursing notes.   HISTORY  Chief Complaint Shortness of Breath   HPI Hannah Elliott is a 72 y.o. female with PMH of CHF, CAD, CVA, A-fib, and recent admission to [facility] for pseudomonas bacteremia s/p 10 day course of Zosyn. The patient subsequently developed respiratory failure and shock which led to a prolonged hospital course. She is anticoagulated and has been compliant with home medications.  The patient was ultimately discharged to [facility] and was being transported for a doctor's appointment this morning when she became short of breath in route with PTAR. They administered albuterol and called EMS who transported to the ED. The patient denies any chest pain, palpitations, nausea.  She is unsure which medication she took this morning. States that she has been urinating her normal amount on Torsemide. No fever/chills.    Past Medical History:  Diagnosis Date  . Aortic stenosis   . Arthritis   . Atrial fibrillation (HCC)   . CHF (congestive heart failure) (HCC)   . Coronary artery disease   . Hypertension   . Stroke [facility])     There are no active problems to display for this patient.   Past Surgical History:  Procedure Laterality Date  . ABDOMINAL HYSTERECTOMY    . BLADDER SURGERY    . CARDIAC CATHETERIZATION    . CORONARY ARTERY BYPASS GRAFT    . JOINT REPLACEMENT     Right total knee replacement  . LAD Stent  2008  . ORIF of right femur and ankle  03/06/2018      Allergies Altace [ramipril]; Biaxin [clarithromycin]; Ciprofloxacin; Latex; Reopro [abciximab]; Simvastatin; and Tape  Family History  Problem Relation Age of Onset  . Cancer Mother   . COPD Mother   . Arthritis Mother   . Diabetes Father     Social History Social History   Tobacco Use  . Smoking status: Never Smoker  . Smokeless tobacco: Never Used  Substance Use Topics  . Alcohol use: Never   Frequency: Never  . Drug use: Never    Review of Systems  Constitutional: No fever/chills Eyes: No visual changes. ENT: No sore throat. Cardiovascular: Denies chest pain. Positive bilateral lower extremity leg swelling.  Respiratory: Positive shortness of breath. Gastrointestinal: No abdominal pain.  No nausea, no vomiting.  No diarrhea.  No constipation. Genitourinary: Negative for dysuria. Musculoskeletal: Negative for back pain. Skin: Negative for rash. Neurological: Negative for headaches, focal weakness or numbness.  10-point ROS otherwise negative.  ____________________________________________   PHYSICAL EXAM:  VITAL SIGNS: ED Triage Vitals  Enc Vitals Group     BP 03/29/18 1026 (!) 143/69     Pulse Rate 03/29/18 1026 (!) 130     Resp 03/29/18 1026 20     Temp 03/29/18 1026 98.1 F (36.7 C)     Temp Source 03/29/18 1026 Oral     SpO2 03/29/18 1026 100 %     Pain Score 03/29/18 1031 0   Constitutional: Alert and answering questions/providing history. Appears slightly drowsy but in no acute respiratory distress at this time.  Eyes: Conjunctivae are normal.  Head: Atraumatic. Nose: No congestion/rhinnorhea. Mouth/Throat: Mucous membranes are moist.  Neck: No stridor.  Cardiovascular: A-fib with RVR. Good peripheral circulation. Grossly normal heart sounds.   Respiratory: Increased respiratory effort.  No retractions. Lungs with crackles at the bases.  Gastrointestinal: Soft and nontender. No distention.  Musculoskeletal: No lower extremity  tenderness. Bilateral 4+ pitting edema. No gross deformities of extremities. Neurologic:  Normal speech and language. No gross focal neurologic deficits are appreciated.  Skin:  Skin is warm, dry and intact. No rash noted.  ____________________________________________   LABS (all labs ordered are listed, but only abnormal results are displayed)  Labs Reviewed  COMPREHENSIVE METABOLIC PANEL - Abnormal; Notable for the  following components:      Result Value   Glucose, Bld 139 (*)    Albumin 2.5 (*)    Total Bilirubin 2.2 (*)    All other components within normal limits  BRAIN NATRIURETIC PEPTIDE - Abnormal; Notable for the following components:   B Natriuretic Peptide 417.1 (*)    All other components within normal limits  CBC WITH DIFFERENTIAL/PLATELET - Abnormal; Notable for the following components:   RBC 3.19 (*)    Hemoglobin 9.6 (*)    HCT 31.5 (*)    RDW 20.8 (*)    All other components within normal limits  I-STAT TROPONIN, ED - Abnormal; Notable for the following components:   Troponin i, poc 0.15 (*)    All other components within normal limits  I-STAT TROPONIN, ED   ____________________________________________  EKG   EKG Interpretation  Date/Time:  Wednesday March 29 2018 10:36:27 EDT Ventricular Rate:  129 PR Interval:    QRS Duration: 99 QT Interval:  340 QTC Calculation: 499 R Axis:   63 Text Interpretation:  Atrial fibrillation Nonspecific repol abnormality, diffuse leads Borderline prolonged QT interval No STEMI.  Confirmed by Alona Bene [phone]) on 03/29/2018 10:53:07 AM       ____________________________________________  RADIOLOGY  Dg Chest 2 View  Result Date: 03/29/2018 CLINICAL DATA:  Shortness of breath. History of CHF, atrial fibrillation, coronary artery disease, and previous CVA. EXAM: CHEST - 2 VIEW COMPARISON:  None in PACs FINDINGS: The lungs are adequately inflated. The interstitial markings are increased. The cardiac silhouette is enlarged. The pulmonary vascularity is engorged. There are bilateral pleural effusions layering posteriorly. The patient has undergone previous CABG. The observed bony thorax is unremarkable. IMPRESSION: Findings likely reflect CHF. Bilateral pleural effusions layering posteriorly. One cannot exclude atelectasis or pneumonia in the left lower lobe. Previous CABG.  Thoracic aortic atherosclerosis. Electronically Signed   By: David   [place] M.D.   On: 03/29/2018 11:11   Ct Angio Chest Pe W And/or Wo Contrast  Result Date: 03/29/2018 CLINICAL DATA:  Acute shortness of breath. EXAM: CT ANGIOGRAPHY CHEST WITH CONTRAST TECHNIQUE: Multidetector CT imaging of the chest was performed using the standard protocol during bolus administration of intravenous contrast. Multiplanar CT image reconstructions and MIPs were obtained to evaluate the vascular anatomy. CONTRAST:  ISOVUE-370 IOPAMIDOL (ISOVUE-370) INJECTION 76% COMPARISON:  Chest radiograph 03/29/2018 FINDINGS: Cardiovascular: Suboptimal evaluation of the segmental pulmonary arteries. No evidence of central hemodynamically significant pulmonary embolus. Possible nonocclusive small pulmonary embolus in a segmental branch of the right upper lobe. Enlarged heart. Marked calcific atherosclerotic disease of the coronary arteries. Post hemorrhage. Mitral and aortic valve annular calcifications. Mediastinum/Nodes: No enlarged mediastinal, hilar, or axillary lymph nodes. Thyroid gland, trachea, and esophagus demonstrate no significant findings. Lungs/Pleura: Mixed alveolar and interstitial opacities bilaterally with dependent predominance. Bilateral small to moderate pleural effusions. Bilateral lower lobe subsegmental atelectasis. Upper Abdomen: No acute abnormality. Musculoskeletal: Spondylosis of the thoracic spine. Review of the MIP images confirms the above findings. IMPRESSION: Suboptimal evaluation of the segmental pulmonary arteries. No evidence of central hemodynamically significant pulmonary embolus. Possible nonocclusive small pulmonary embolus in a  segmental branch of the right upper lobe. Cardiomegaly, mixed pattern pulmonary edema with small to moderate bilateral pleural effusions and bibasilar subsegmental atelectasis. Aortic Atherosclerosis (ICD10-I70.0). Electronically Signed   By: Ted Mcalpine M.D.   On: 03/29/2018 14:06     ____________________________________________   PROCEDURES  Procedure(s) performed:   Procedures  CRITICAL CARE Performed by: Maia Plan Total critical care time: 45 minutes Critical care time was exclusive of separately billable procedures and treating other patients. Critical care was necessary to treat or prevent imminent or life-threatening deterioration. Critical care was time spent personally by me on the following activities: development of treatment plan with patient and/or surrogate as well as nursing, discussions with consultants, evaluation of patient's response to treatment, examination of patient, obtaining history from patient or surrogate, ordering and performing treatments and interventions, ordering and review of laboratory studies, ordering and review of radiographic studies, pulse oximetry and re-evaluation of patient's condition.  Alona Bene, MD Emergency Medicine  ____________________________________________   INITIAL IMPRESSION / ASSESSMENT AND PLAN / ED COURSE  Pertinent labs & imaging results that were available during my care of the patient were reviewed by me and considered in my medical decision making (see chart for details).  Patient presents to the emergency department for evaluation of shortness of breath.  Patient denies chest pain or palpitations.  She was given albuterol in route which may be contributing to her A. fib RVR.  She was found to have A. fib during her recent hospitalization at [facility] and started on anticoagulation.  She is taking amiodarone for rate control but unclear if she took her dose this morning. Plan to reach out to the facility to collect [facility] information. Afebrile here. No hypotension. Plan for labs, CXR, and reassess.   CXR reviewed with pulmonary edema and effusions. Question infiltrate on the left but patient and husband clearly deny any symptoms to suggest PNA. Will hold on abx for now. Considered PE with recent  hospitalization but with significant edema on x-ray and exam along with history of worsening LE edema I think that PE is less likely.   12:50 PM Discussed the case with Cardiology who will see the patient.   Cardiology to follow. Agree with diuresis and amiodarone for rate control. BP ok now but beta blockers and diuresis withheld on prior admission with hypotension so would prefer Amiodarone. Repeat troponin 0.14 so not up trending. CTA reviewed which shows a non-occlusive PE. Patient is already anticoagulated.   Discussed patient's case with Hospitalist to request admission. Patient and family (if present) updated with plan. Care transferred to Hospitalist service.  I reviewed all nursing notes, vitals, pertinent old records, EKGs, labs, imaging (as available).  ____________________________________________  FINAL CLINICAL IMPRESSION(S) / ED DIAGNOSES  Final diagnoses:  Acute respiratory distress  Acute congestive heart failure, unspecified heart failure type (HCC)     MEDICATIONS GIVEN DURING THIS VISIT:  Medications  iopamidol (ISOVUE-370) 76 % injection (has no administration in time range)  amiodarone (NEXTERONE PREMIX) 360-4.14 MG/200ML-% (1.8 mg/mL) IV infusion (has no administration in time range)    Followed by  amiodarone (NEXTERONE PREMIX) 360-4.14 MG/200ML-% (1.8 mg/mL) IV infusion (has no administration in time range)  furosemide (LASIX) injection 60 mg (60 mg Intravenous Given 03/29/18 1221)  iopamidol (ISOVUE-370) 76 % injection 100 mL (100 mLs Intravenous Contrast Given 03/29/18 1340)  amiodarone (NEXTERONE) IV bolus only 150 mg/100 mL (150 mg Intravenous New Bag/Given 03/29/18 1457)    Note:  This document was prepared  using Conservation officer, historic buildingsDragon voice recognition software and may include unintentional dictation errors.  Alona BeneJoshua Long, MD Emergency Medicine    Long, Arlyss RepressJoshua G, MD 03/29/18 [phone]

## 2018-03-30 ENCOUNTER — Inpatient Hospital Stay (HOSPITAL_COMMUNITY): Payer: Medicare Other

## 2018-03-30 DIAGNOSIS — I4819 Other persistent atrial fibrillation: Secondary | ICD-10-CM

## 2018-03-30 DIAGNOSIS — I481 Persistent atrial fibrillation: Secondary | ICD-10-CM

## 2018-03-30 DIAGNOSIS — I824Z1 Acute embolism and thrombosis of unspecified deep veins of right distal lower extremity: Secondary | ICD-10-CM

## 2018-03-30 DIAGNOSIS — I5043 Acute on chronic combined systolic (congestive) and diastolic (congestive) heart failure: Secondary | ICD-10-CM

## 2018-03-30 DIAGNOSIS — M79609 Pain in unspecified limb: Secondary | ICD-10-CM

## 2018-03-30 DIAGNOSIS — M7989 Other specified soft tissue disorders: Secondary | ICD-10-CM

## 2018-03-30 DIAGNOSIS — I10 Essential (primary) hypertension: Secondary | ICD-10-CM

## 2018-03-30 DIAGNOSIS — I361 Nonrheumatic tricuspid (valve) insufficiency: Secondary | ICD-10-CM

## 2018-03-30 LAB — COMPREHENSIVE METABOLIC PANEL
ALBUMIN: 2.3 g/dL — AB (ref 3.5–5.0)
ALK PHOS: 78 U/L (ref 38–126)
ALT: 11 U/L (ref 0–44)
ALT: 13 U/L (ref 0–44)
AST: 27 U/L (ref 15–41)
AST: 31 U/L (ref 15–41)
Albumin: 2.5 g/dL — ABNORMAL LOW (ref 3.5–5.0)
Alkaline Phosphatase: 71 U/L (ref 38–126)
Anion gap: 9 (ref 5–15)
Anion gap: 15 (ref 5–15)
BUN: 15 mg/dL (ref 8–23)
BUN: 14 mg/dL (ref 8–23)
CALCIUM: 9.5 mg/dL (ref 8.9–10.3)
CHLORIDE: 95 mmol/L — AB (ref 98–111)
CHLORIDE: 92 mmol/L — AB (ref 98–111)
CO2: 36 mmol/L — AB (ref 22–32)
CO2: 34 mmol/L — AB (ref 22–32)
CREATININE: 0.64 mg/dL (ref 0.44–1.00)
Calcium: 9.3 mg/dL (ref 8.9–10.3)
Creatinine, Ser: 0.53 mg/dL (ref 0.44–1.00)
GFR calc Af Amer: >60 mL/min (ref >60)
GFR calc Af Amer: >60 mL/min (ref >60)
GFR calc non Af Amer: >60 mL/min (ref >60)
GLUCOSE: 110 mg/dL — AB (ref 70–99)
GLUCOSE: 127 mg/dL — AB (ref 70–99)
POTASSIUM: 3.5 mmol/L (ref 3.5–5.1)
Potassium: 3.5 mmol/L (ref 3.5–5.1)
SODIUM: 140 mmol/L (ref 135–145)
SODIUM: 141 mmol/L (ref 135–145)
Total Bilirubin: 1.8 mg/dL — ABNORMAL HIGH (ref 0.3–1.2)
Total Bilirubin: 2.2 mg/dL — ABNORMAL HIGH (ref 0.3–1.2)
Total Protein: 6.5 g/dL (ref 6.5–8.1)
Total Protein: 7.3 g/dL (ref 6.5–8.1)

## 2018-03-30 LAB — CBC WITH DIFFERENTIAL/PLATELET
BASOS ABS: 0 10*3/uL (ref 0.0–0.1)
Basophils Relative: 0 %
EOS ABS: 0.1 10*3/uL (ref 0.0–0.7)
EOS PCT: 1 %
HCT: 31.7 % — ABNORMAL LOW (ref 36.0–46.0)
HEMOGLOBIN: 9.7 g/dL — AB (ref 12.0–15.0)
LYMPHS ABS: 1.1 10*3/uL (ref 0.7–4.0)
LYMPHS PCT: 9 %
MCH: 30 pg (ref 26.0–34.0)
MCHC: 30.6 g/dL (ref 30.0–36.0)
MCV: 98.1 fL (ref 78.0–100.0)
Monocytes Absolute: 0.7 10*3/uL (ref 0.1–1.0)
Monocytes Relative: 6 %
NEUTROS PCT: 84 %
Neutro Abs: 10.1 10*3/uL — ABNORMAL HIGH (ref 1.7–7.7)
PLATELETS: 256 10*3/uL (ref 150–400)
RBC: 3.23 MIL/uL — AB (ref 3.87–5.11)
RDW: 20.7 % — ABNORMAL HIGH (ref 11.5–15.5)
WBC: 12 10*3/uL — AB (ref 4.0–10.5)

## 2018-03-30 LAB — CBC
HEMATOCRIT: 28.8 % — AB (ref 36.0–46.0)
Hemoglobin: 8.7 g/dL — ABNORMAL LOW (ref 12.0–15.0)
MCH: 29.7 pg (ref 26.0–34.0)
MCHC: 30.2 g/dL (ref 30.0–36.0)
MCV: 98.3 fL (ref 78.0–100.0)
Platelets: 220 10*3/uL (ref 150–400)
RBC: 2.93 MIL/uL — ABNORMAL LOW (ref 3.87–5.11)
RDW: 20.7 % — ABNORMAL HIGH (ref 11.5–15.5)
WBC: 4.4 10*3/uL (ref 4.0–10.5)

## 2018-03-30 LAB — ECHOCARDIOGRAM COMPLETE
HEIGHTINCHES: 66 in
Weight: 4282.22 oz

## 2018-03-30 LAB — GLUCOSE, CAPILLARY: GLUCOSE-CAPILLARY: 119 mg/dL — AB (ref 70–99)

## 2018-03-30 LAB — TYPE AND SCREEN
ABO/RH(D): A POS
Antibody Screen: NEGATIVE

## 2018-03-30 LAB — BLOOD GAS, ARTERIAL
Acid-Base Excess: 12.1 mmol/L — ABNORMAL HIGH (ref 0.0–2.0)
BICARBONATE: 36.4 mmol/L — AB (ref 20.0–28.0)
Drawn by: 33147
O2 Content: 4 L/min
O2 Saturation: 90 %
PATIENT TEMPERATURE: 98.6
PH ART: 7.517 — AB (ref 7.350–7.450)
PO2 ART: 55.5 mmHg — AB (ref 83.0–108.0)
pCO2 arterial: 45.2 mmHg (ref 32.0–48.0)

## 2018-03-30 LAB — MAGNESIUM: Magnesium: 1.9 mg/dL (ref 1.7–2.4)

## 2018-03-30 MED ORDER — KETOROLAC TROMETHAMINE 15 MG/ML IJ SOLN
15.0000 mg | Freq: Once | INTRAMUSCULAR | Status: AC
  Administered 2018-03-30: 15 mg via INTRAVENOUS
  Filled 2018-03-30: qty 1

## 2018-03-30 MED ORDER — APIXABAN 5 MG PO TABS
5.0000 mg | ORAL_TABLET | Freq: Two times a day (BID) | ORAL | Status: DC
Start: 2018-04-06 — End: 2018-03-30

## 2018-03-30 MED ORDER — PERFLUTREN LIPID MICROSPHERE
1.0000 mL | INTRAVENOUS | Status: AC | PRN
  Administered 2018-03-30: 2 mL via INTRAVENOUS
  Filled 2018-03-30: qty 10

## 2018-03-30 MED ORDER — FUROSEMIDE 10 MG/ML IJ SOLN
80.0000 mg | Freq: Three times a day (TID) | INTRAMUSCULAR | Status: DC
  Administered 2018-03-30 – 2018-04-02 (×8): 80 mg via INTRAVENOUS
  Filled 2018-03-30 (×9): qty 8

## 2018-03-30 MED ORDER — APIXABAN 5 MG PO TABS: 10.0000 mg | ORAL_TABLET | Freq: Two times a day (BID) | ORAL | Status: DC

## 2018-03-30 MED ORDER — SODIUM CHLORIDE 0.9 % IR SOLN
Freq: Once | Status: DC
  Filled 2018-03-30: qty 500

## 2018-03-30 MED ORDER — PERFLUTREN LIPID MICROSPHERE
INTRAVENOUS | Status: AC
  Filled 2018-03-30: qty 10

## 2018-03-30 MED ORDER — ENOXAPARIN SODIUM 120 MG/0.8ML ~~LOC~~ SOLN
120.0000 mg | Freq: Two times a day (BID) | SUBCUTANEOUS | Status: DC
  Administered 2018-03-30 – 2018-04-01 (×5): 120 mg via SUBCUTANEOUS
  Filled 2018-03-30 (×7): qty 0.8

## 2018-03-30 NOTE — Progress Notes (Signed)
*  Preliminary Results* Bilateral lower extremity venous duplex completed. Bilateral lower extremities are positive for acute deep vein thrombosis involving bilateral posterior tibial and peroneal veins. There is no evidence of Baker's cyst bilaterally.  Preliminary results discussed with Ginger, RN.  03/30/2018 10:34 AM Gertie FeyMichelle Clotine Heiner, BS, RVT, RDCS

## 2018-03-30 NOTE — Consult Note (Signed)
Reason for Consult:s/p RLE ORIF Referring Physician: Marlowe Sax  Hannah Elliott is an 72 y.o. female.  HPI: Hannah Elliott underwent ORIF of a periprosthetic distal right femur fx and perc fixation of her right ankle on 7/8. She was due to return to [facility] for f/u yesterday when she became acutely SOB. EMS was called and she was brought to [facility] for evaluation. Orthopedic surgery was consulted regarding her retained staples as well as guidance in the post-surgical period. In addition to her SOB she c/o continued ankle/foot pain and back pain.   Past Medical History:  Diagnosis Date  . Aortic stenosis   . Arthritis   . Atrial fibrillation ([place])   . CHF (congestive heart failure) ([place])   . Coronary artery disease   . Hypertension   . Stroke [facility])     Past Surgical History:  Procedure Laterality Date  . ABDOMINAL HYSTERECTOMY    . BLADDER SURGERY    . CARDIAC CATHETERIZATION    . CORONARY ARTERY BYPASS GRAFT    . JOINT REPLACEMENT     Right total knee replacement  . LAD Stent  2008  . ORIF of right femur and ankle  03/06/2018    Family History  Problem Relation Age of Onset  . Cancer Mother   . COPD Mother   . Arthritis Mother   . Diabetes Father     Social History:  reports that she has never smoked. She has never used smokeless tobacco. She reports that she does not drink alcohol or use drugs.  Allergies:  Allergies  Allergen Reactions  . Altace [Ramipril]     Listed on MAR  . Biaxin [Clarithromycin]     Listed on MAR  . Ciprofloxacin     Listed on MAR  . Latex     Listed on MAR  . Reopro [Abciximab]     Listed on MAR  . Simvastatin     Listed on MAR  . Tape     (adhesives) Listed on MAR    Medications: I have reviewed the patient's current medications.  Results for orders placed or performed during the hospital encounter of 03/29/18 (from the past 48 hour(s))  Comprehensive metabolic panel     Status: Abnormal   Collection Time: 03/29/18 11:10 AM  Result Value Ref Range    Sodium 142 135 - 145 mmol/L   Potassium 4.0 3.5 - 5.1 mmol/L   Chloride 100 98 - 111 mmol/L   CO2 32 22 - 32 mmol/L   Glucose, Bld 139 (H) 70 - 99 mg/dL   BUN 18 8 - 23 mg/dL   Creatinine, Ser 0.56 0.44 - 1.00 mg/dL   Calcium 9.7 8.9 - 10.3 mg/dL   Total Protein 7.5 6.5 - 8.1 g/dL   Albumin 2.5 (L) 3.5 - 5.0 g/dL   AST 30 15 - 41 U/L   ALT 13 0 - 44 U/L   Alkaline Phosphatase 81 38 - 126 U/L   Total Bilirubin 2.2 (H) 0.3 - 1.2 mg/dL   GFR calc non Af Amer >60 >60 mL/min   GFR calc Af Amer >60 >60 mL/min    Comment: (NOTE) The eGFR has been calculated using the CKD EPI equation. This calculation has not been validated in all clinical situations. eGFR's persistently <60 mL/min signify possible Chronic Kidney Disease.    Anion gap 10 5 - 15    Comment: Performed at [facility], [place] [address] [place], [place] [address]  Brain natriuretic peptide  Status: Abnormal   Collection Time: 03/29/18 11:10 AM  Result Value Ref Range   B Natriuretic Peptide 417.1 (H) 0.0 - 100.0 pg/mL    Comment: Performed at [facility], [place] [address] [place], [place] [address]  CBC with Differential     Status: Abnormal   Collection Time: 03/29/18 11:10 AM  Result Value Ref Range   WBC 4.3 4.0 - 10.5 K/uL   RBC 3.19 (L) 3.87 - 5.11 MIL/uL   Hemoglobin 9.6 (L) 12.0 - 15.0 g/dL   HCT 31.5 (L) 36.0 - 46.0 %   MCV 98.7 78.0 - 100.0 fL   MCH 30.1 26.0 - 34.0 pg   MCHC 30.5 30.0 - 36.0 g/dL   RDW 20.8 (H) 11.5 - 15.5 %   Platelets 236 150 - 400 K/uL   Neutrophils Relative % 69 %   Neutro Abs 3.0 1.7 - 7.7 K/uL   Lymphocytes Relative 17 %   Lymphs Abs 0.8 0.7 - 4.0 K/uL   Monocytes Relative 9 %   Monocytes Absolute 0.4 0.1 - 1.0 K/uL   Eosinophils Relative 5 %   Eosinophils Absolute 0.2 0.0 - 0.7 K/uL   Basophils Relative 0 %   Basophils Absolute 0.0 0.0 - 0.1 K/uL    Comment: Performed at [facility], [place] [address],  [place], [place] [address]  I-stat troponin, ED     Status: Abnormal   Collection Time: 03/29/18 11:14 AM  Result Value Ref Range   Troponin i, poc 0.15 (HH) 0.00 - 0.08 ng/mL   Comment NOTIFIED PHYSICIAN    Comment 3            Comment: Due to the release kinetics of cTnI, a negative result within the first hours of the onset of symptoms does not rule out myocardial infarction with certainty. If myocardial infarction is still suspected, repeat the test at appropriate intervals.   I-stat troponin, ED     Status: Abnormal   Collection Time: 03/29/18  3:01 PM  Result Value Ref Range   Troponin i, poc 0.14 (HH) 0.00 - 0.08 ng/mL   Comment NOTIFIED PHYSICIAN    Comment 3            Comment: Due to the release kinetics of cTnI, a negative result within the first hours of the onset of symptoms does not rule out myocardial infarction with certainty. If myocardial infarction is still suspected, repeat the test at appropriate intervals.   MRSA PCR Screening     Status: None   Collection Time: 03/29/18  6:42 PM  Result Value Ref Range   MRSA by PCR NEGATIVE NEGATIVE    Comment:        The GeneXpert MRSA Assay (FDA approved for NASAL specimens only), is one component of a comprehensive MRSA colonization surveillance program. It is not intended to diagnose MRSA infection nor to guide or monitor treatment for MRSA infections. Performed at [facility], [place] [address] [place], [place] [address]   TSH     Status: Abnormal   Collection Time: 03/29/18  6:50 PM  Result Value Ref Range   TSH 5.807 (H) 0.350 - 4.500 uIU/mL    Comment: Performed by a 3rd Generation assay with a functional sensitivity of <=0.01 uIU/mL. Performed at [facility], [place] [address] [place], [place] [address]   Comprehensive metabolic panel     Status: Abnormal   Collection Time: 03/30/18  3:26 AM  Result Value Ref  Range   Sodium 140 135 - 145 mmol/L   Potassium 3.5 3.5 -  5.1 mmol/L   Chloride 95 (L) 98 - 111 mmol/L   CO2 36 (H) 22 - 32 mmol/L   Glucose, Bld 110 (H) 70 - 99 mg/dL   BUN 15 8 - 23 mg/dL   Creatinine, Ser 0.53 0.44 - 1.00 mg/dL   Calcium 9.3 8.9 - 10.3 mg/dL   Total Protein 6.5 6.5 - 8.1 g/dL   Albumin 2.3 (L) 3.5 - 5.0 g/dL   AST 27 15 - 41 U/L   ALT 11 0 - 44 U/L   Alkaline Phosphatase 71 38 - 126 U/L   Total Bilirubin 1.8 (H) 0.3 - 1.2 mg/dL   GFR calc non Af Amer >60 >60 mL/min   GFR calc Af Amer >60 >60 mL/min    Comment: (NOTE) The eGFR has been calculated using the CKD EPI equation. This calculation has not been validated in all clinical situations. eGFR's persistently <60 mL/min signify possible Chronic Kidney Disease.    Anion gap 9 5 - 15    Comment: Performed at [facility], [place] [address] [place], [place] [address]  CBC     Status: Abnormal   Collection Time: 03/30/18  3:26 AM  Result Value Ref Range   WBC 4.4 4.0 - 10.5 K/uL   RBC 2.93 (L) 3.87 - 5.11 MIL/uL   Hemoglobin 8.7 (L) 12.0 - 15.0 g/dL   HCT 28.8 (L) 36.0 - 46.0 %   MCV 98.3 78.0 - 100.0 fL   MCH 29.7 26.0 - 34.0 pg   MCHC 30.2 30.0 - 36.0 g/dL   RDW 20.7 (H) 11.5 - 15.5 %   Platelets 220 150 - 400 K/uL    Comment: Performed at [facility] [place] [address] [place], [place] [address]  Magnesium     Status: None   Collection Time: 03/30/18  3:26 AM  Result Value Ref Range   Magnesium 1.9 1.7 - 2.4 mg/dL    Comment: Performed at [facility], [place] [address], [place], [place] [address]    Dg Chest 2 View  Result Date: 03/29/2018 CLINICAL DATA:  Shortness of breath. History of CHF, atrial fibrillation, coronary artery disease, and previous CVA. EXAM: CHEST - 2 VIEW COMPARISON:  None in PACs FINDINGS: The lungs are adequately inflated. The interstitial markings are increased. The cardiac silhouette is enlarged. The pulmonary vascularity is engorged. There are bilateral pleural effusions layering  posteriorly. The patient has undergone previous CABG. The observed bony thorax is unremarkable. IMPRESSION: Findings likely reflect CHF. Bilateral pleural effusions layering posteriorly. One cannot exclude atelectasis or pneumonia in the left lower lobe. Previous CABG.  Thoracic aortic atherosclerosis. Electronically Signed   By: David  [place]ique M.D.   On: 03/29/2018 11:11   Dg Ankle Complete Right  Result Date: 03/29/2018 CLINICAL DATA:  Ankle fracture EXAM: RIGHT ANKLE - COMPLETE 3+ VIEW COMPARISON:  None. FINDINGS: No prior radiographs available for comparison. Screw fixation of the distal fibula. Fixating pins across medial malleolar fracture without significant displacement. Long presumed metallic fixating device extending from plantar surface of the foot to the distal shaft of the tibia. Suspect that there may be a posterior malleolar fracture. IMPRESSION: Surgically fixated fractures involving the distal fibula and tibia. Hardware appears intact. Alignment is near anatomic. Electronically Signed   By: Donavan Foil M.D.   On: 03/29/2018 17:18   Ct Angio Chest Pe W And/or Wo Contrast  Result Date: 03/29/2018 CLINICAL DATA:  Acute shortness of breath. EXAM: CT ANGIOGRAPHY CHEST WITH CONTRAST TECHNIQUE: Multidetector CT imaging of the chest was performed using the standard protocol during bolus administration of intravenous contrast. Multiplanar CT image reconstructions and MIPs were obtained to evaluate the vascular anatomy. CONTRAST:  151m ISOVUE-370 IOPAMIDOL (ISOVUE-370) INJECTION 76% COMPARISON:  Chest radiograph 03/29/2018 FINDINGS: Cardiovascular: Suboptimal evaluation of the segmental pulmonary arteries. No evidence of central hemodynamically significant pulmonary embolus. Possible nonocclusive small pulmonary embolus in a segmental branch of the right upper lobe. Enlarged heart. Marked calcific atherosclerotic disease of the coronary arteries. Post hemorrhage. Mitral and aortic valve annular  calcifications. Mediastinum/Nodes: No enlarged mediastinal, hilar, or axillary lymph nodes. Thyroid gland, trachea, and esophagus demonstrate no significant findings. Lungs/Pleura: Mixed alveolar and interstitial opacities bilaterally with dependent predominance. Bilateral small to moderate pleural effusions. Bilateral lower lobe subsegmental atelectasis. Upper Abdomen: No acute abnormality. Musculoskeletal: Spondylosis of the thoracic spine. Review of the MIP images confirms the above findings. IMPRESSION: Suboptimal evaluation of the segmental pulmonary arteries. No evidence of central hemodynamically significant pulmonary embolus. Possible nonocclusive small pulmonary embolus in a segmental branch of the right upper lobe. Cardiomegaly, mixed pattern pulmonary edema with small to moderate bilateral pleural effusions and bibasilar subsegmental atelectasis. Aortic Atherosclerosis (ICD10-I70.0). Electronically Signed   By: DFidela SalisburyM.D.   On: 03/29/2018 14:06   Dg Foot Complete Right  Result Date: 03/29/2018 CLINICAL DATA:  Fractures EXAM: RIGHT FOOT COMPLETE - 3+ VIEW COMPARISON:  None. FINDINGS: No prior radiographs available for comparison. Osteopenia limits the exam. There appear to be mildly displaced fractures at the neck of the second proximal phalanx, head of the third proximal phalanx, and neck of the fourth proximal phalanx. Possible nondisplaced fracture neck of the fifth proximal phalanx. Dorsiflexion at the MCP joints. Possible dorsal subluxation of the base of either the fourth or fifth proximal phalanx with respect to the head of the metacarpal. IMPRESSION: 1. Osteopenia limits the exam. Suspected mildly displaced fractures involving the distal aspects of the second through fifth proximal phalanges. Appearance of possible dorsal subluxation of either the fourth or fifth proximal phalanx with respect to the head of the metatarsal. Electronically Signed   By: KDonavan FoilM.D.   On:  03/29/2018 17:26   Dg Femur, Min 2 Views Right  Result Date: 03/29/2018 CLINICAL DATA:  Femur fracture EXAM: RIGHT FEMUR 2 VIEWS COMPARISON:  None. FINDINGS: No prior radiographs available for comparison. Cutaneous staples within the right thigh. Patient is status post surgical plate and multiple screw fixation of the proximal to distal femur. Status post knee replacement. Comminuted and slightly impacted distal femoral fracture adjacent to the knee prosthesis. Vascular calcifications. Diffuse soft tissue edema. IMPRESSION: 1. Status post knee replacement. Surgical plate and screw fixation of the right femur with impacted and slightly comminuted distal femoral fracture slightly cephalad to the knee replacement. No significant bridging callus at this time. Electronically Signed   By: KDonavan FoilM.D.   On: 03/29/2018 17:14    Review of Systems  Constitutional: Negative for weight loss.  HENT: Negative for ear discharge, ear pain, hearing loss and tinnitus.   Eyes: Negative for blurred vision, double vision, photophobia and pain.  Respiratory: Positive for shortness of breath. Negative for cough and sputum production.   Cardiovascular: Negative for chest pain.  Gastrointestinal: Negative for abdominal pain, nausea and vomiting.  Genitourinary: Negative for dysuria, flank pain, frequency and urgency.  Musculoskeletal: Positive for back pain and joint pain (Right  leg). Negative for falls, myalgias and neck pain.  Neurological: Negative for dizziness, tingling, sensory change, focal weakness, loss of consciousness and headaches.  Endo/Heme/Allergies: Does not bruise/bleed easily.  Psychiatric/Behavioral: Negative for depression, memory loss and substance abuse. The patient is not nervous/anxious.    Blood pressure (!) 117/50, pulse (!) 101, temperature 99.1 F (37.3 C), temperature source Oral, resp. rate 10, height _0  (1.676 m), weight 121.4 kg (267 lb 10.2 oz), SpO2 99 %. Physical Exam   Constitutional: She appears well-developed and well-nourished. No distress.  HENT:  Head: Normocephalic and atraumatic.  Eyes: Conjunctivae are normal. Right eye exhibits no discharge. Left eye exhibits no discharge. No scleral icterus.  Neck: Normal range of motion.  Cardiovascular: Normal rate and regular rhythm.  Respiratory: She is in respiratory distress.  Musculoskeletal:  RLE No traumatic wounds, ecchymosis, or rash  Foot TTP, protruded pins             Staple line about knee well healed, mild erythema, no discharge  Sens DPN, SPN, TN intact  Motor EHL, ext, flex, evers 5/5  DP 2+, 2+ edema  Neurological: She is alert.  Skin: Skin is warm and dry. She is not diaphoretic.  Psychiatric: She has a normal mood and affect. Her behavior is normal.    Assessment/Plan: S/p ORIF distal right femur -- Staples removed. NWB. Encourage AROM/PROM S/p perc fixation right ankle -- Fixation appears stable. Standard pin care. NWB. -----Pt should return to [facility] for f/u as soon as feasible     Lisette Abu, PA-C Orthopedic Surgery [phone] 03/30/2018, 9:17 AM

## 2018-03-30 NOTE — Progress Notes (Addendum)
[facility] Progress Note  Patient: Hannah Elliott R [place] ZOX:[id number]   PCP: Patient, No Pcp Per DOB: 07/12/1946   DOA: 03/29/2018   DOS: 03/30/2018   Date of Service: the patient was seen and examined on 03/30/2018  Subjective: Patient was seen twice, earlier during the day patient was awake and oriented, did not have any acute complaints she mentioned that her pain was well controlled.  No nausea no vomiting.  She felt that her breathing was better.  No diarrhea reported.  She was able to eat breakfast. Later in the afternoon after receiving her opioids patient was more sleepy drowsy and was unable to follow any commands.  Brief hospital course: Pt. With complex medical history significant for recent hospitalization for pseudomonas bacteremia at [facility] in addition to R femoral and ankle fx s/p ORIF, CAD s/p CABG, atrial fibrillation on eliquis, heart failure, chronic lower extremity wounds, and several other medical problems presenting with worsening shortness of breath due to suspected heart failure exacerbation as well as atrial fibrillation with RVR and possible PE. Patient was at [facility] for rehabilitation [facility], patient was supposed to go for a follow-up appointment with [facility] and in route she complained of shortness of breath with tachycardia and EMS was called and patient was brought to this long hospital. Currently further plan is continuing rate control and treatment for CHF.  Assessment and Plan: 1.  Acute hypoxic respiratory failure. Acute on chronic diastolic and systolic CHF. A. fib with RVR. Hypotension. Cardiology consulted. Patient started on IV Lasix every 8 hours. We will monitor renal function. Shortness of breath is currently improving. Also started on IV amiodarone infusion.  Rate is still not well controlled. We will monitor recommendation from cardiology. Patient on Eliquis for anticoagulation.  2.  Acute bilateral lower extremity DVT  and pulmonary embolism. Scan of the chest on admission is positive for right upper lobe PE. Although the study was inadequate. Her lower extremity is positive for bilateral DVT which are acute. Patient is on Eliquis although she reports that at home she was taking it only once a day. Patient has [facility] between 03/04/2018 and 03/11/2018 and from there has been discharged to [facility] where she has been to this admission. I am presuming that the patient was getting her Eliquis twice a day during this whole length of stay. With this I suspect that patient should be switched to another anticoagulant. For now patient is been lethargic and therefore we will switch her to Lovenox but ideally would switch her to Xarelto which would also improve patient's compliance since she was taking Eliquis only once a day as it was too much for her to take. Will discuss with cardiology tomorrow. Currently no indication for thrombolysis.  3.  Acute encephalopathy likely toxic due to medication. Patient is currently drowsy and lethargic and nonresponsive but withdraws to painful stimuli. CT head unremarkable ABG unremarkable. Patient received scheduled OxyContin around noon time. At present given her multiple fractures I do not believe reversing it Narcan is a good idea unless the patient becomes hemodynamically unstable. We will monitor.  Discontinue scheduled narcotics and continue only PRN medications.  4.  Right femoral ankle fracture. [facility]. Cherlynn PoloStaples still in place admission.  Orthopedic consulted, staples removed nonweightbearing to right lower extremity. [facility]. Ankle pin still in place.  5.  Chronic bilateral lower extremity wounds. Full-thickness ulceration of the left  posterior calf, right lower exam to surgical wound. Wound care consulted, silicone foam to the posterior left calf wound and medial malleolar  wound change every 3 days and as needed. Posterior calf wound painted with Betadine daily allowed to air dry cover with silicone foam.  6.  CAD S/P CABG. Elevated troponin. Cardiology following. Likely demand ischemia. On aspirin-currently on hold due to drowsiness  7 chronic anemia. H&H stable. Monitor.  Diet: cardiac diet DVT Prophylaxis: on therapeutic anticoagulation.  Advance goals of care discussion: full code  Family Communication: family was present at bedside, at the time of interview. The pt provided permission to discuss medical plan with the family. Opportunity was given to ask question and all questions were answered satisfactorily.   Disposition:  Discharge to be determined.  Consultants: cardiology, wound care, orthopedics  Procedures: none  Antibiotics: Anti-infectives (From admission, onward)   None       Objective: Physical Exam: Vitals:   03/30/18 1300 03/30/18 1400 03/30/18 1500 03/30/18 1600  BP: (!) 119/42 128/63 (!) 114/51   Pulse: (!) 104 98 (!) 111 (!) 124  Resp: 18 (!) 24 (!) 27 (!) 23  Temp:    (!) 102.6 F (39.2 C)  TempSrc:    Oral  SpO2: 99% 100% 100% 99%  Weight:      Height:        Intake/Output Summary (Last 24 hours) at 03/30/2018 1819 Last data filed at 03/30/2018 1300 Gross per 24 hour  Intake 772.58 ml  Output 2550 ml  Net -1777.42 ml   Filed Weights   03/29/18 1820 03/29/18 1856  Weight: 65.4 kg (144 lb 2.9 oz) 121.4 kg (267 lb 10.2 oz)   General: Alert, Awake and Oriented to Time, Place and Person. Appear in moderate distress, affect appropriate Eyes: PERRL, Conjunctiva normal ENT: Oral Mucosa clear moist. Neck: difficult to assess  JVD, no Abnormal Mass Or lumps Cardiovascular: S1 and S2 Present, aortic systolic  Murmur, Peripheral Pulses Present Respiratory: increased respiratory effort, Bilateral Air entry equal and Decreased, no use of accessory muscle, bilateral basal Crackles, no wheezes Abdomen: Bowel Sound  present, Soft and no tenderness, no hernia Skin: no redness, no Rash, no induration Extremities: bilateral  Pedal edema, no calf tenderness Neurologic: Grossly no focal neuro deficit. Bilaterally Equal motor strength  General: Appear in marked distress, no Rash; Oral Mucosa dry. no Abnormal Mass Or lumps Cardiovascular: S1 and S2 Present, aortic systolic  Murmur, Respiratory: increased respiratory effort, Bilateral Air entry present and basal crackles, no wheezes Abdomen: Bowel Sound present, Soft and no tenderness,  Extremities: bilateral  Pedal edema, no calf tenderness Neurology: Drowsy, lethargic, unable to follow command, withdraws to painful stimuli.    Data Reviewed: CBC: Recent Labs  Lab 03/29/18 1110 03/30/18 0326 03/30/18 1747  WBC 4.3 4.4 12.0*  NEUTROABS 3.0  --  10.1*  HGB 9.6* 8.7* 9.7*  HCT 31.5* 28.8* 31.7*  MCV 98.7 98.3 98.1  PLT 236 220 256   Basic Metabolic Panel: Recent Labs  Lab 03/29/18 1110 03/30/18 0326  NA 142 140  K 4.0 3.5  CL 100 95*  CO2 32 36*  GLUCOSE 139* 110*  BUN 18 15  CREATININE 0.56 0.53  CALCIUM 9.7 9.3  MG  --  1.9    Liver Function Tests: Recent Labs  Lab 03/29/18 1110 03/30/18 0326  AST 30 27  ALT 13 11  ALKPHOS 81 71  BILITOT 2.2* 1.8*  PROT 7.5 6.5  ALBUMIN 2.5* 2.3*  No results for input(s): LIPASE, AMYLASE in the last 168 hours. No results for input(s): AMMONIA in the last 168 hours. Coagulation Profile: No results for input(s): INR, PROTIME in the last 168 hours. Cardiac Enzymes: No results for input(s): CKTOTAL, CKMB, CKMBINDEX, TROPONINI in the last 168 hours. BNP (last 3 results) No results for input(s): PROBNP in the last 8760 hours. CBG: Recent Labs  Lab 03/30/18 1639  GLUCAP 119*   Studies: Ct Head Wo Contrast  Result Date: 03/30/2018 CLINICAL DATA:  72 y/o  F; altered mental status. EXAM: CT HEAD WITHOUT CONTRAST TECHNIQUE: Contiguous axial images were obtained from the base of the skull  through the vertex without intravenous contrast. COMPARISON:  None. FINDINGS: Brain: No evidence of acute infarction, hemorrhage, hydrocephalus, extra-axial collection or mass lesion/mass effect. Mild chronic microvascular ischemic changes and volume loss of the brain. Vascular: Calcific atherosclerosis of carotid siphons. No hyperdense vessel identified. Skull: Normal. Negative for fracture or focal lesion. Sinuses/Orbits: No acute finding. Other: None. IMPRESSION: 1. No acute intracranial abnormality identified. 2. Mild chronic microvascular ischemic changes and volume loss of the brain for age. Electronically Signed   By: Mitzi Hansen M.D.   On: 03/30/2018 17:35    Scheduled Meds: . chlorhexidine  15 mL Mouth Rinse BID  . cholecalciferol  1,000 Units Oral Daily  . furosemide  80 mg Intravenous Q8H  . hydrogen peroxide 1/2 strength irrigation   Irrigation Once  . ketorolac  15 mg Intravenous Once  . magnesium oxide  400 mg Oral BID  . mouth rinse  15 mL Mouth Rinse q12n4p  . metoprolol tartrate  12.5 mg Oral BID  . midodrine  10 mg Oral TID WC  . vitamin C  500 mg Oral Daily   Continuous Infusions: . amiodarone 30 mg/hr (03/30/18 1300)   PRN Meds: acetaminophen **OR** acetaminophen, oxyCODONE, polyethylene glycol, simethicone  Time spent: The patient is critically ill with multiple organ systems failure and requires high complexity decision making for assessment and support, frequent evaluation and titration of therapies. Critical Care Time devoted to patient care services described in this note is 35 minutes   Author: Lynden Oxford, MD [facility] Pager: [phone] 03/30/2018 6:19 PM  If 7PM-7AM, please contact night-coverage at [email or web address], password [email or web address]

## 2018-03-30 NOTE — Progress Notes (Signed)
  Amiodarone Drug - Drug Interaction Consult Note  Recommendations: No significant drug- drug intxns noted at this time  Amiodarone is metabolized by the cytochrome P450 system and therefore has the potential to cause many drug interactions. Amiodarone has an average plasma half-life of 50 days (range 20 to 100 days).   There is potential for drug interactions to occur several weeks or months after stopping treatment and the onset of drug interactions may be slow after initiating amiodarone.   []  Statins: Increased risk of myopathy. Simvastatin- restrict dose to 20mg  daily.  Other statins: counsel patients to report any muscle pain or weakness immediately.  [x]  Anticoagulants: Amiodarone can increase anticoagulant effect. Consider warfarin dose reduction. Patients should be monitored closely and the dose of anticoagulant altered accordingly, remembering that amiodarone levels take several weeks to stabilize.  Apixaban anticoagulation:  P-gp inhibitors (Amiodarone) may increase the serum concentration of Apixaban.  No need to dose adjust, but monitor for increased risk of bleeding.  []  Antiepileptics: Amiodarone can increase plasma concentration of phenytoin, the dose should be reduced. Note that small changes in phenytoin dose can result in large changes in levels. Monitor patient and counsel on signs of toxicity.  [x]  Beta blockers: increased risk of bradycardia, AV block and myocardial depression. Sotalol - avoid concomitant use.  Metoprolol 12.5 mg PO BID  Midodrine 10 mg TID - may enhance the bradycardic effect of Bradycardia-Causing Agents  []   Calcium channel blockers (diltiazem and verapamil): increased risk of bradycardia, AV block and myocardial depression.  []   Cyclosporine: Amiodarone increases levels of cyclosporine. Reduced dose of cyclosporine is recommended.  []  Digoxin dose should be halved when amiodarone is started.  [x]  Diuretics: increased risk of cardiotoxicity if  hypokalemia occurs.  Lasix 80 mg IV q8h  []  Oral hypoglycemic agents (glyburide, glipizide, glimepiride): increased risk of hypoglycemia. Patient's glucose levels should be monitored closely when initiating amiodarone therapy.   []  Drugs that prolong the QT interval:  Torsades de pointes risk may be increased with concurrent use - avoid if possible.  Monitor QTc, also keep magnesium/potassium WNL if concurrent therapy can't be avoided. Marland Kitchen. Antibiotics: e.g. fluoroquinolones, erythromycin. . Antiarrhythmics: e.g. quinidine, procainamide, disopyramide, sotalol. . Antipsychotics: e.g. phenothiazines, haloperidol.  . Lithium, tricyclic antidepressants, and methadone.  Thank Bonita QuinYou,  Lynann Beaverhristine Brelyn Woehl PharmD, BCPS Pager [phone] 03/30/2018 2:12 PM

## 2018-03-30 NOTE — Progress Notes (Signed)
  Echocardiogram 2D Echocardiogram has been performed.  Hannah Elliott, Hannah Elliott 03/30/2018, 1:50 PM

## 2018-03-30 NOTE — Progress Notes (Signed)
ANTICOAGULATION CONSULT NOTE  Pharmacy Consult for Lovenox Indication: pulmonary embolus  Allergies  Allergen Reactions  . Altace [Ramipril]     Listed on MAR  . Biaxin [Clarithromycin]     Listed on MAR  . Ciprofloxacin     Listed on MAR  . Latex     Listed on MAR  . Reopro [Abciximab]     Listed on MAR  . Simvastatin     Listed on MAR  . Tape     (adhesives) [facility]   Patient Measurements: Height: 5\' 6"  (167.6 cm) Weight: 267 lb 10.2 oz (121.4 kg) IBW/kg (Calculated) : 59.3  Vital Signs: Temp: 102.6 F (39.2 C) (08/01 1600) Temp Source: Oral (08/01 1600) BP: 114/51 (08/01 1500) Pulse Rate: 124 (08/01 1600)  Labs: Recent Labs    03/29/18 1110 03/30/18 0326 03/30/18 1747  HGB 9.6* 8.7* 9.7*  HCT 31.5* 28.8* 31.7*  PLT 236 220 256  CREATININE 0.56 0.53 0.64   Estimated Creatinine Clearance: 81.9 mL/min (by C-G formula based on SCr of 0.64 mg/dL).  Medical History: Past Medical History:  Diagnosis Date  . Aortic stenosis   . Arthritis   . Atrial fibrillation (HCC)   . CHF (congestive heart failure) (HCC)   . Coronary artery disease   . Hypertension   . Stroke [facility](HCC)    Medications:  Scheduled:  . chlorhexidine  15 mL Mouth Rinse BID  . cholecalciferol  1,000 Units Oral Daily  . enoxaparin (LOVENOX) injection  120 mg Subcutaneous Q12H  . furosemide  80 mg Intravenous Q8H  . hydrogen peroxide 1/2 strength irrigation   Irrigation Once  . magnesium oxide  400 mg Oral BID  . mouth rinse  15 mL Mouth Rinse q12n4p  . metoprolol tartrate  12.5 mg Oral BID  . midodrine  10 mg Oral TID WC  . vitamin C  500 mg Oral Daily   Assessment: [age] yoF admitted 7/31. Chest CT with possible subsegmental PE likely in the setting of leg fracture and surgery while anticoagulation held and only taking Eliquis once per day. 8/1 also found acute DVT.  Cards is increased Eliquis to 10mg  bid x7 days then back down to 5mg   8/1 pm noted unable to swallow capsule - change  to Lovenox Hgb low, stable, Plt wnl Goal of Therapy:  Monitor platelets by anticoagulation protocol: Yes   Plan:   Discontinued Apixaban   Lovenox 120mg  SQ q12 (1mg /kg q12)  Could consider dose change to Lovenox 180mg  SQ daily (1.5mg /kg daily)  Otho BellowsGreen, Evalyne Cortopassi L PharmD Pager [phone] 03/30/2018, 7:07 PM

## 2018-03-30 NOTE — Progress Notes (Signed)
Progress Note  Patient Name: Hannah Elliott Date of Encounter: 03/30/2018  Primary Cardiologist: [facility], new to Dr. Duke Salvia  Subjective   Pt having LE pain secondary to pin placement. Denies chest pain or palpitations. Remains in AF   Inpatient Medications    Scheduled Meds: . apixaban  5 mg Oral BID  . aspirin EC  81 mg Oral Daily  . chlorhexidine  15 mL Mouth Rinse BID  . cholecalciferol  1,000 Units Oral Daily  . furosemide  60 mg Intravenous Q8H  . gabapentin  300 mg Oral TID  . magnesium oxide  400 mg Oral BID  . mouth rinse  15 mL Mouth Rinse q12n4p  . metoprolol tartrate  12.5 mg Oral BID  . midodrine  10 mg Oral TID WC  . oxyCODONE  10 mg Oral Q12H  . vitamin C  500 mg Oral Daily   Continuous Infusions: . amiodarone 30 mg/hr (03/29/18 2126)   PRN Meds: acetaminophen **OR** acetaminophen, oxyCODONE, polyethylene glycol, simethicone   Vital Signs    Vitals:   03/30/18 0400 03/30/18 0500 03/30/18 0600 03/30/18 0800  BP: (!) 96/46 (!) 114/47 (!) 117/50   Pulse: 94 95 (!) 101   Resp: 10 10 10    Temp:    99.1 F (37.3 C)  TempSrc:    Oral  SpO2: 98% 100% 99%   Weight:      Height:        Intake/Output Summary (Last 24 hours) at 03/30/2018 0838 Last data filed at 03/30/2018 7829 Gross per 24 hour  Intake 916.15 ml  Output 3550 ml  Net -2633.85 ml   Filed Weights   03/29/18 1820 03/29/18 1856  Weight: 144 lb 2.9 oz (65.4 kg) 267 lb 10.2 oz (121.4 kg)   Physical Exam   General: Elderly, frail,  NAD Skin: Warm, dry, intact  Head: Normocephalic, atraumatic, =clear, moist mucus membranes. Neck: Negative for carotid bruits. No JVD Lungs:Clear to ausculation bilaterally. No wheezes, rales, or rhonchi. Breathing is unlabored. Cardiovascular: Irregularly irregular with S1 S2. No murmurs, rubs or gallops,. Smal LV heave appreciated. Abdomen: Soft, non-tender, non-distended with normoactive bowel sounds. No obvious abdominal masses. MSK:  Strength and tone appear normal for age. 5/5 in all extremities Extremities: 2+LE edema. Right foot pins in place. No clubbing or cyanosis. DP/PT pulses 1+ bilaterally Neuro: Alert and oriented. No focal deficits. No facial asymmetry. MAE spontaneously. Psych: Responds to questions appropriately with normal affect.    Labs    Chemistry Recent Labs  Lab 03/29/18 1110 03/30/18 0326  NA 142 140  K 4.0 3.5  CL 100 95*  CO2 32 36*  GLUCOSE 139* 110*  BUN 18 15  CREATININE 0.56 0.53  CALCIUM 9.7 9.3  PROT 7.5 6.5  ALBUMIN 2.5* 2.3*  AST 30 27  ALT 13 11  ALKPHOS 81 71  BILITOT 2.2* 1.8*  GFRNONAA >60 >60  GFRAA >60 >60  ANIONGAP 10 9     Hematology Recent Labs  Lab 03/29/18 1110 03/30/18 0326  WBC 4.3 4.4  RBC 3.19* 2.93*  HGB 9.6* 8.7*  HCT 31.5* 28.8*  MCV 98.7 98.3  MCH 30.1 29.7  MCHC 30.5 30.2  RDW 20.8* 20.7*  PLT 236 220    Cardiac EnzymesNo results for input(s): TROPONINI in the last 168 hours.  Recent Labs  Lab 03/29/18 1114 03/29/18 1501  TROPIPOC 0.15* 0.14*     BNP Recent Labs  Lab 03/29/18 1110  BNP 417.1*  DDimer No results for input(s): DDIMER in the last 168 hours.   Radiology    Dg Chest 2 View  Result Date: 03/29/2018 CLINICAL DATA:  Shortness of breath. History of CHF, atrial fibrillation, coronary artery disease, and previous CVA. EXAM: CHEST - 2 VIEW COMPARISON:  None in PACs FINDINGS: The lungs are adequately inflated. The interstitial markings are increased. The cardiac silhouette is enlarged. The pulmonary vascularity is engorged. There are bilateral pleural effusions layering posteriorly. The patient has undergone previous CABG. The observed bony thorax is unremarkable. IMPRESSION: Findings likely reflect CHF. Bilateral pleural effusions layering posteriorly. One cannot exclude atelectasis or pneumonia in the left lower lobe. Previous CABG.  Thoracic aortic atherosclerosis. Electronically Signed   By: David  SwazilandJordan M.D.   On:  03/29/2018 11:11   Dg Ankle Complete Right  Result Date: 03/29/2018 CLINICAL DATA:  Ankle fracture EXAM: RIGHT ANKLE - COMPLETE 3+ VIEW COMPARISON:  None. FINDINGS: No prior radiographs available for comparison. Screw fixation of the distal fibula. Fixating pins across medial malleolar fracture without significant displacement. Long presumed metallic fixating device extending from plantar surface of the foot to the distal shaft of the tibia. Suspect that there may be a posterior malleolar fracture. IMPRESSION: Surgically fixated fractures involving the distal fibula and tibia. Hardware appears intact. Alignment is near anatomic. Electronically Signed   By: Jasmine PangKim  Fujinaga M.D.   On: 03/29/2018 17:18   Ct Angio Chest Pe W And/or Wo Contrast  Result Date: 03/29/2018 CLINICAL DATA:  Acute shortness of breath. EXAM: CT ANGIOGRAPHY CHEST WITH CONTRAST TECHNIQUE: Multidetector CT imaging of the chest was performed using the standard protocol during bolus administration of intravenous contrast. Multiplanar CT image reconstructions and MIPs were obtained to evaluate the vascular anatomy. CONTRAST:  100mL ISOVUE-370 IOPAMIDOL (ISOVUE-370) INJECTION 76% COMPARISON:  Chest radiograph 03/29/2018 FINDINGS: Cardiovascular: Suboptimal evaluation of the segmental pulmonary arteries. No evidence of central hemodynamically significant pulmonary embolus. Possible nonocclusive small pulmonary embolus in a segmental branch of the right upper lobe. Enlarged heart. Marked calcific atherosclerotic disease of the coronary arteries. Post hemorrhage. Mitral and aortic valve annular calcifications. Mediastinum/Nodes: No enlarged mediastinal, hilar, or axillary lymph nodes. Thyroid gland, trachea, and esophagus demonstrate no significant findings. Lungs/Pleura: Mixed alveolar and interstitial opacities bilaterally with dependent predominance. Bilateral small to moderate pleural effusions. Bilateral lower lobe subsegmental atelectasis.  Upper Abdomen: No acute abnormality. Musculoskeletal: Spondylosis of the thoracic spine. Review of the MIP images confirms the above findings. IMPRESSION: Suboptimal evaluation of the segmental pulmonary arteries. No evidence of central hemodynamically significant pulmonary embolus. Possible nonocclusive small pulmonary embolus in a segmental branch of the right upper lobe. Cardiomegaly, mixed pattern pulmonary edema with small to moderate bilateral pleural effusions and bibasilar subsegmental atelectasis. Aortic Atherosclerosis (ICD10-I70.0). Electronically Signed   By: Ted Mcalpineobrinka  Dimitrova M.D.   On: 03/29/2018 14:06   Dg Foot Complete Right  Result Date: 03/29/2018 CLINICAL DATA:  Fractures EXAM: RIGHT FOOT COMPLETE - 3+ VIEW COMPARISON:  None. FINDINGS: No prior radiographs available for comparison. Osteopenia limits the exam. There appear to be mildly displaced fractures at the neck of the second proximal phalanx, head of the third proximal phalanx, and neck of the fourth proximal phalanx. Possible nondisplaced fracture neck of the fifth proximal phalanx. Dorsiflexion at the MCP joints. Possible dorsal subluxation of the base of either the fourth or fifth proximal phalanx with respect to the head of the metacarpal. IMPRESSION: 1. Osteopenia limits the exam. Suspected mildly displaced fractures involving the distal aspects of the second  through fifth proximal phalanges. Appearance of possible dorsal subluxation of either the fourth or fifth proximal phalanx with respect to the head of the metatarsal. Electronically Signed   By: Jasmine Pang M.D.   On: 03/29/2018 17:26   Dg Femur, Min 2 Views Right  Result Date: 03/29/2018 CLINICAL DATA:  Femur fracture EXAM: RIGHT FEMUR 2 VIEWS COMPARISON:  None. FINDINGS: No prior radiographs available for comparison. Cutaneous staples within the right thigh. Patient is status post surgical plate and multiple screw fixation of the proximal to distal femur. Status post  knee replacement. Comminuted and slightly impacted distal femoral fracture adjacent to the knee prosthesis. Vascular calcifications. Diffuse soft tissue edema. IMPRESSION: 1. Status post knee replacement. Surgical plate and screw fixation of the right femur with impacted and slightly comminuted distal femoral fracture slightly cephalad to the knee replacement. No significant bridging callus at this time. Electronically Signed   By: Jasmine Pang M.D.   On: 03/29/2018 17:14    Telemetry    03/30/18 atrial fibrillation HR 107- Personally Reviewed  ECG    03/29/2018 atrial fibrillation HR 129- Personally Reviewed  Cardiac Studies   Echocardiogram 03/30/2017: pending results  Patient Profile     72 y.o. female with a hx of atrial fibrillation, s/p ablation of aflutter on chronic eliquis, CAD s/p CABG 2008 (LIMA-LAD, SVG to OM), ischemic cardiomyopathy with EF of 45-50% , most recent cath with patent grafts (2010), lymphedema and lower extremity ulcers due to venous stasis who is being seen today for the evaluation of atrial fibrillation with RVR and acute on chronic systolic and diastolic heart failure at the request of Dr. Jacqulyn Bath.  Assessment & Plan    1.  Atrial fibrillation with RVR: -Remains in AF with rates in the low 100's  -Continue amiodarone gtt for now given sub-optimal rates with eventual transition to PO if able to tolerate with good response.  -Continue Eliquis  -BP stable at 117/50, 114/47, 117/43>> will add metoprolol 12.5 mg twice daily for greater rate control and monitor BP closely   2.  Acute on chronic systolic heart failure: -CXR with pulmonary edema BNP elevated at 417.1 -Likely in the setting of no diuretic since hospital discharge   3.  CAD s/p CABG with elevated troponin: -Troponin, 0.15 -No anginal symptoms with no plans for ischemic work-up at this time  4.  Pulmonary embolism: -Chest CT with possible subsegmental PE likely in the setting of leg fracture and  surgery while anticoagulation held and only taking Eliquis once per day  -On supplemental O2 with saturations stable   -Continue Eliquis -Monitor for s/s of bleeding   Signed, Georgie Chard NP-C [facility] Pager: [phone] 03/30/2018, 8:38 AM     For questions or updates, please contact   Please consult [email or web address] for contact info under Cardiology/STEMI.

## 2018-03-30 NOTE — Consult Note (Signed)
WOC Nurse wound consult note Reason for Consult: LEwounds Patient's husband in the room to give me update on LE wounds. Orthopedics has seen her RLE for pins that are exposed in the right foot and ankle and staples. Patient has a full thickness ulceration on the left posterior calf that the husband resulted from a fall and still hasn't healed about 3 months ago. On the right LE she has a healing site on the medial malleolus and on the posterior calf.  Wound type: venous stasis, trauma and surgical  Pressure Injury POA: /NA Measurement: Right  posterior: 3cm x 2cm x 0.2cm  Left posterior: 2cm x 1cmx 0.1cm  Right medial ankle: 2cm x 1cm x 0.1cm  Wound bed: Left posterior: 100% pink and granulating Right  posterior 50% eschar 50% approximated incision Right medial ankle; clean dry, healing, some serous dried fluid   Drainage (amount, consistency, odor) left posterior, scant, no odor. None from the right LE wounds Periwound: edema 2+ bilaterally weak distal pulses Dressing procedure/placement/frequency: Silicone foam to the posterior left calf wound and the medial malleolar wound, change every 3 days and PRN soilage.  Paint right posterior calf wound with betadine daily, allow to air dry. Cover with silicone foam.  Updated pin care orders per orthopedics    Discussed POC with patient and bedside nurse.  Re consult if needed, will not follow at this time. Thanks  Ghina Bittinger MSN, [employer],CWOCN, CNS, CWON-AP [phone])

## 2018-03-31 ENCOUNTER — Inpatient Hospital Stay (HOSPITAL_COMMUNITY): Payer: Medicare Other

## 2018-03-31 DIAGNOSIS — A419 Sepsis, unspecified organism: Secondary | ICD-10-CM

## 2018-03-31 DIAGNOSIS — R0603 Acute respiratory distress: Secondary | ICD-10-CM

## 2018-03-31 DIAGNOSIS — R570 Cardiogenic shock: Secondary | ICD-10-CM

## 2018-03-31 DIAGNOSIS — I350 Nonrheumatic aortic (valve) stenosis: Secondary | ICD-10-CM

## 2018-03-31 LAB — BASIC METABOLIC PANEL
ANION GAP: 12 (ref 5–15)
BUN: 15 mg/dL (ref 8–23)
CALCIUM: 9.2 mg/dL (ref 8.9–10.3)
CO2: 36 mmol/L — AB (ref 22–32)
Chloride: 95 mmol/L — ABNORMAL LOW (ref 98–111)
Creatinine, Ser: 0.63 mg/dL (ref 0.44–1.00)
GFR calc Af Amer: >60 mL/min (ref >60)
GFR calc non Af Amer: >60 mL/min (ref >60)
GLUCOSE: 124 mg/dL — AB (ref 70–99)
Potassium: 3.1 mmol/L — ABNORMAL LOW (ref 3.5–5.1)
Sodium: 143 mmol/L (ref 135–145)

## 2018-03-31 LAB — PROTIME-INR
INR: 3.9
Prothrombin Time: 37.9 seconds — ABNORMAL HIGH (ref 11.4–15.2)

## 2018-03-31 LAB — CBC
HEMATOCRIT: 29.4 % — AB (ref 36.0–46.0)
HEMOGLOBIN: 8.9 g/dL — AB (ref 12.0–15.0)
MCH: 30 pg (ref 26.0–34.0)
MCHC: 30.3 g/dL (ref 30.0–36.0)
MCV: 99 fL (ref 78.0–100.0)
Platelets: 215 10*3/uL (ref 150–400)
RBC: 2.97 MIL/uL — ABNORMAL LOW (ref 3.87–5.11)
RDW: 20.6 % — ABNORMAL HIGH (ref 11.5–15.5)
WBC: 10.5 10*3/uL (ref 4.0–10.5)

## 2018-03-31 LAB — PROCALCITONIN: PROCALCITONIN: 1.16 ng/mL

## 2018-03-31 LAB — MAGNESIUM: MAGNESIUM: 1.9 mg/dL (ref 1.7–2.4)

## 2018-03-31 LAB — ABO/RH: ABO/RH(D): A POS

## 2018-03-31 MED ORDER — SODIUM CHLORIDE 0.9 % IV SOLN
INTRAVENOUS | Status: DC | PRN
  Administered 2018-03-31: 1000 mL via INTRAVENOUS

## 2018-03-31 MED ORDER — PRO-STAT SUGAR FREE PO LIQD
30.0000 mL | Freq: Two times a day (BID) | ORAL | Status: DC
  Administered 2018-03-31 – 2018-04-03 (×6): 30 mL via ORAL
  Filled 2018-03-31 (×6): qty 30

## 2018-03-31 MED ORDER — VANCOMYCIN HCL 10 G IV SOLR
2000.0000 mg | Freq: Once | INTRAVENOUS | Status: AC
  Administered 2018-03-31: 2000 mg via INTRAVENOUS
  Filled 2018-03-31: qty 2000

## 2018-03-31 MED ORDER — PIPERACILLIN-TAZOBACTAM 3.375 G IVPB
3.3750 g | Freq: Three times a day (TID) | INTRAVENOUS | Status: DC
  Administered 2018-04-01 – 2018-04-02 (×4): 3.375 g via INTRAVENOUS
  Filled 2018-03-31 (×4): qty 50

## 2018-03-31 MED ORDER — ENSURE ENLIVE PO LIQD
237.0000 mL | ORAL | Status: DC
  Administered 2018-04-01 – 2018-04-03 (×2): 237 mL via ORAL

## 2018-03-31 MED ORDER — HYDROGEN PEROXIDE 3 % EX SOLN
CUTANEOUS | Status: AC
  Administered 2018-03-31: 01:00:00
  Filled 2018-03-31: qty 473

## 2018-03-31 MED ORDER — PIPERACILLIN-TAZOBACTAM 3.375 G IVPB 30 MIN: 3.3750 g | Freq: Once | INTRAVENOUS | Status: DC

## 2018-03-31 MED ORDER — PIPERACILLIN-TAZOBACTAM 3.375 G IVPB
3.3750 g | Freq: Once | INTRAVENOUS | Status: AC
  Administered 2018-03-31: 3.375 g via INTRAVENOUS
  Filled 2018-03-31: qty 50

## 2018-03-31 MED ORDER — VANCOMYCIN HCL IN DEXTROSE 1-5 GM/200ML-% IV SOLN: 1000.0000 mg | Freq: Once | INTRAVENOUS | Status: DC

## 2018-03-31 MED ORDER — VANCOMYCIN HCL IN DEXTROSE 1-5 GM/200ML-% IV SOLN
1000.0000 mg | Freq: Two times a day (BID) | INTRAVENOUS | Status: DC
  Administered 2018-03-31 – 2018-04-02 (×4): 1000 mg via INTRAVENOUS
  Filled 2018-03-31 (×4): qty 200

## 2018-03-31 MED ORDER — ADULT MULTIVITAMIN W/MINERALS CH
1.0000 | ORAL_TABLET | Freq: Every day | ORAL | Status: DC
  Administered 2018-04-01 – 2018-04-03 (×3): 1 via ORAL
  Filled 2018-03-31 (×3): qty 1

## 2018-03-31 MED ORDER — POTASSIUM CHLORIDE 10 MEQ/100ML IV SOLN
10.0000 meq | INTRAVENOUS | Status: AC
  Administered 2018-03-31 (×3): 10 meq via INTRAVENOUS
  Filled 2018-03-31 (×3): qty 100

## 2018-03-31 NOTE — Progress Notes (Signed)
Initial Nutrition Assessment  DOCUMENTATION CODES:   Morbid obesity  INTERVENTION:  - Will order Ensure Enlive once/day, this supplement provides 350 kcal and 20 grams of protein. - Will order 30 mL Prostat BID, each supplement provides 100 kcal and 15 grams of protein. - Will order daily multivitamin with minerals.  - Continue to encourage PO intakes. - Tech/RN to assist with feeding, if needed.   NUTRITION DIAGNOSIS:   Inadequate oral intake related to acute illness, decreased appetite as evidenced by per patient/family report.  GOAL:   Patient will meet greater than or equal to 90% of their needs  MONITOR:   PO intake, Supplement acceptance, Weight trends, Labs, Skin, I & O's  REASON FOR ASSESSMENT:   Low Braden  ASSESSMENT:   72 y.o. female with complex medical history significant for recent hospitalization for pseudomonas bacteremia at [facility] in addition to R femoral and ankle fx s/p ORIF, CAD s/p CABG, atrial fibrillation on eliquis, heart failure, chronic lower extremity wounds, and several other medical problems presenting with worsening shortness of breath due to suspected heart failure exacerbation as well as atrial fibrillation with RVR and possible PE.  No intakes documented since admission. Per notes and flowsheet, patient was disoriented and not alert yesterday but has had marked improvement today. Patient able to have a conversation with RD and all responses seemed appropriate. She reports that she has only had water so far today, nothing to eat. Patient reports that appetite is usually good but that since hospitalization she has not eaten and that she did not eat for 2 days prior to admission to [facility] last month. She confirms appetite usually decreases when she is sick.  Patient asked for a sip of water from cup at bedside. Noted that she had some weakness and needed help holding cup and open lid to get a piece of ice. Patient denies any chewing issues but states  that she "sometimes" experiences swallowing difficulty with feeling like items are getting stuck in her throat. She was unable to be more specific about this.  NFPE outlined below. Per review of Care Everywhere, she weighed 271 lbs at [facility] on 12/15/17 and 247 lbs at [facility] on 03/06/18. Per notes, patient had R femur and R ankle fracture at time of admission to [facility] and is s/p ORIF on 7/8. Following d/c from [facility] she was at [facility] and has been there since the time of this admission.    Medications reviewed; 1000 units vitamin D/day, 80 mg IV Lasix TID, 10 mEq IV KCl x4 runs today.  Labs reviewed; K: 3.1 mmol/L, Cl: 95 mmol/L.      NUTRITION - FOCUSED PHYSICAL EXAM:  Completed; no muscle and no fat wasting; severe/deep pitting edema noted to BLE.   Diet Order:   Diet Order           Diet Heart Room service appropriate? Yes; Fluid consistency: Thin  Diet effective now          EDUCATION NEEDS:   No education needs have been identified at this time  Skin:  Skin Assessment: Skin Integrity Issues: Skin Integrity Issues:: Other (Comment) Other: venous stasis ulcers to R ankle and L leg  Last BM:  8/2  Height:   Ht Readings from Last 1 Encounters:  03/29/18 5\' 6"  (1.676 m)    Weight:   Wt Readings from Last 1 Encounters:  03/29/18 267 lb 10.2 oz (121.4 kg)    Ideal Body Weight:  59.09 kg  BMI:  Body mass index is 43.2 kg/m.  Estimated Nutritional Needs:   Kcal:  1820-2060 (15-17 kcal/kg)  Protein:  125-135 grams  Fluid:  >/= 1.6 L/day     Trenton GammonJessica Pierra Skora, MS, RD, LDN, [facility] Inpatient Clinical Dietitian Pager # [phone] After hours/weekend pager # [phone]

## 2018-03-31 NOTE — Progress Notes (Signed)
Progress Note  Patient Name: Hannah Elliott Date of Encounter: 03/31/2018  [facility] new to Dr. Duke Salviaandolph  Subjective   Pt sleepy but responsive today. Has continued complaints of generalized pain "all over". Has acute DVT in bilateral LE's. Changed to high dose Lovenox secondary to difficult swallowing yesterday afternoon. IV amiodarone stopped secondary to low BP>>stable now  Inpatient Medications    Scheduled Meds: . chlorhexidine  15 mL Mouth Rinse BID  . cholecalciferol  1,000 Units Oral Daily  . enoxaparin (LOVENOX) injection  120 mg Subcutaneous Q12H  . furosemide  80 mg Intravenous Q8H  . hydrogen peroxide 1/2 strength irrigation   Irrigation Once  . magnesium oxide  400 mg Oral BID  . mouth rinse  15 mL Mouth Rinse q12n4p  . metoprolol tartrate  12.5 mg Oral BID  . midodrine  10 mg Oral TID WC   Continuous Infusions: . amiodarone Stopped (03/31/18 0109)  . potassium chloride     PRN Meds: acetaminophen **OR** acetaminophen, oxyCODONE, polyethylene glycol, simethicone   Vital Signs    Vitals:   03/30/18 2000 03/30/18 2242 03/31/18 0000 03/31/18 0326  BP: (!) 123/56  (!) 111/47   Pulse: (!) 111 (!) 109 (!) 106   Resp: (!) 23 13 (!) 29   Temp: (!) 103 F (39.4 C) (!) 100.4 F (38 C) (!) 100.7 F (38.2 C) 98.7 F (37.1 C)  TempSrc: Oral Oral Oral Oral  SpO2: 94% 99% 100%   Weight:      Height:        Intake/Output Summary (Last 24 hours) at 03/31/2018 0809 Last data filed at 03/31/2018 0447 Gross per 24 hour  Intake 271.45 ml  Output 1650 ml  Net -1378.55 ml   Filed Weights   03/29/18 1820 03/29/18 1856  Weight: 144 lb 2.9 oz (65.4 kg) 267 lb 10.2 oz (121.4 kg)    Physical Exam   General: Elderly, NAD Skin: Warm, dry, intact  Head: Normocephalic, atraumatic, clear, moist mucus membranes. Neck: Negative for carotid bruits. No JVD Lungs:Clear to ausculation bilaterally. No wheezes, rales, or rhonchi. Breathing is  unlabored. Cardiovascular: Irregularly irregular with S1 S2. + murmur. No rubs, gallops or LV heave appreciated. Abdomen: Soft, non-tender, non-distended with normoactive bowel sounds.  No obvious abdominal masses. MSK: Strength and tone appear normal for age. 5/5 in all extremities Extremities: 2+ LE edema. No clubbing or cyanosis. DP/PT pulses 1+ bilaterally Neuro: Alert and oriented but somnolent. No focal deficits. No facial asymmetry. MAE spontaneously. Psych: Responds to questions appropriately with normal affect.    Labs    Chemistry Recent Labs  Lab 03/29/18 1110 03/30/18 0326 03/30/18 1747 03/31/18 0333  NA 142 140 141 143  K 4.0 3.5 3.5 3.1*  CL 100 95* 92* 95*  CO2 32 36* 34* 36*  GLUCOSE 139* 110* 127* 124*  BUN 18 15 14 15   CREATININE 0.56 0.53 0.64 0.63  CALCIUM 9.7 9.3 9.5 9.2  PROT 7.5 6.5 7.3  --   ALBUMIN 2.5* 2.3* 2.5*  --   AST 30 27 31   --   ALT 13 11 13   --   ALKPHOS 81 71 78  --   BILITOT 2.2* 1.8* 2.2*  --   GFRNONAA >60 >60 >60 >60  GFRAA >60 >60 >60 >60  ANIONGAP 10 9 15 12      Hematology Recent Labs  Lab 03/30/18 0326 03/30/18 1747 03/31/18 0333  WBC 4.4 12.0* 10.5  RBC 2.93*  3.23* 2.97*  HGB 8.7* 9.7* 8.9*  HCT 28.8* 31.7* 29.4*  MCV 98.3 98.1 99.0  MCH 29.7 30.0 30.0  MCHC 30.2 30.6 30.3  RDW 20.7* 20.7* 20.6*  PLT 220 256 215    Cardiac EnzymesNo results for input(s): TROPONINI in the last 168 hours.  Recent Labs  Lab 03/29/18 1114 03/29/18 1501  TROPIPOC 0.15* 0.14*     BNP Recent Labs  Lab 03/29/18 1110  BNP 417.1*     DDimer No results for input(s): DDIMER in the last 168 hours.   Radiology    Dg Chest 2 View  Result Date: 03/29/2018 CLINICAL DATA:  Shortness of breath. History of CHF, atrial fibrillation, coronary artery disease, and previous CVA. EXAM: CHEST - 2 VIEW COMPARISON:  None in PACs FINDINGS: The lungs are adequately inflated. The interstitial markings are increased. The cardiac silhouette is  enlarged. The pulmonary vascularity is engorged. There are bilateral pleural effusions layering posteriorly. The patient has undergone previous CABG. The observed bony thorax is unremarkable. IMPRESSION: Findings likely reflect CHF. Bilateral pleural effusions layering posteriorly. One cannot exclude atelectasis or pneumonia in the left lower lobe. Previous CABG.  Thoracic aortic atherosclerosis. Electronically Signed   By: David  [place] M.D.   On: 03/29/2018 11:11   Dg Ankle Complete Right  Result Date: 03/29/2018 CLINICAL DATA:  Ankle fracture EXAM: RIGHT ANKLE - COMPLETE 3+ VIEW COMPARISON:  None. FINDINGS: No prior radiographs available for comparison. Screw fixation of the distal fibula. Fixating pins across medial malleolar fracture without significant displacement. Long presumed metallic fixating device extending from plantar surface of the foot to the distal shaft of the tibia. Suspect that there may be a posterior malleolar fracture. IMPRESSION: Surgically fixated fractures involving the distal fibula and tibia. Hardware appears intact. Alignment is near anatomic. Electronically Signed   By: Jasmine Pang M.D.   On: 03/29/2018 17:18   Ct Head Wo Contrast  Result Date: 03/30/2018 CLINICAL DATA:  72 y/o  F; altered mental status. EXAM: CT HEAD WITHOUT CONTRAST TECHNIQUE: Contiguous axial images were obtained from the base of the skull through the vertex without intravenous contrast. COMPARISON:  None. FINDINGS: Brain: No evidence of acute infarction, hemorrhage, hydrocephalus, extra-axial collection or mass lesion/mass effect. Mild chronic microvascular ischemic changes and volume loss of the brain. Vascular: Calcific atherosclerosis of carotid siphons. No hyperdense vessel identified. Skull: Normal. Negative for fracture or focal lesion. Sinuses/Orbits: No acute finding. Other: None. IMPRESSION: 1. No acute intracranial abnormality identified. 2. Mild chronic microvascular ischemic changes and volume  loss of the brain for age. Electronically Signed   By: Mitzi Hansen M.D.   On: 03/30/2018 17:35   Ct Angio Chest Pe W And/or Wo Contrast  Result Date: 03/29/2018 CLINICAL DATA:  Acute shortness of breath. EXAM: CT ANGIOGRAPHY CHEST WITH CONTRAST TECHNIQUE: Multidetector CT imaging of the chest was performed using the standard protocol during bolus administration of intravenous contrast. Multiplanar CT image reconstructions and MIPs were obtained to evaluate the vascular anatomy. CONTRAST:  ISOVUE-370 IOPAMIDOL (ISOVUE-370) INJECTION 76% COMPARISON:  Chest radiograph 03/29/2018 FINDINGS: Cardiovascular: Suboptimal evaluation of the segmental pulmonary arteries. No evidence of central hemodynamically significant pulmonary embolus. Possible nonocclusive small pulmonary embolus in a segmental branch of the right upper lobe. Enlarged heart. Marked calcific atherosclerotic disease of the coronary arteries. Post hemorrhage. Mitral and aortic valve annular calcifications. Mediastinum/Nodes: No enlarged mediastinal, hilar, or axillary lymph nodes. Thyroid gland, trachea, and esophagus demonstrate no significant findings. Lungs/Pleura: Mixed alveolar and interstitial opacities bilaterally  with dependent predominance. Bilateral small to moderate pleural effusions. Bilateral lower lobe subsegmental atelectasis. Upper Abdomen: No acute abnormality. Musculoskeletal: Spondylosis of the thoracic spine. Review of the MIP images confirms the above findings. IMPRESSION: Suboptimal evaluation of the segmental pulmonary arteries. No evidence of central hemodynamically significant pulmonary embolus. Possible nonocclusive small pulmonary embolus in a segmental branch of the right upper lobe. Cardiomegaly, mixed pattern pulmonary edema with small to moderate bilateral pleural effusions and bibasilar subsegmental atelectasis. Aortic Atherosclerosis (ICD10-I70.0). Electronically Signed   By: Ted Mcalpine M.D.    On: 03/29/2018 14:06   Dg Foot Complete Right  Result Date: 03/29/2018 CLINICAL DATA:  Fractures EXAM: RIGHT FOOT COMPLETE - 3+ VIEW COMPARISON:  None. FINDINGS: No prior radiographs available for comparison. Osteopenia limits the exam. There appear to be mildly displaced fractures at the neck of the second proximal phalanx, head of the third proximal phalanx, and neck of the fourth proximal phalanx. Possible nondisplaced fracture neck of the fifth proximal phalanx. Dorsiflexion at the MCP joints. Possible dorsal subluxation of the base of either the fourth or fifth proximal phalanx with respect to the head of the metacarpal. IMPRESSION: 1. Osteopenia limits the exam. Suspected mildly displaced fractures involving the distal aspects of the second through fifth proximal phalanges. Appearance of possible dorsal subluxation of either the fourth or fifth proximal phalanx with respect to the head of the metatarsal. Electronically Signed   By: Jasmine Pang M.D.   On: 03/29/2018 17:26   Dg Femur, Min 2 Views Right  Result Date: 03/29/2018 CLINICAL DATA:  Femur fracture EXAM: RIGHT FEMUR 2 VIEWS COMPARISON:  None. FINDINGS: No prior radiographs available for comparison. Cutaneous staples within the right thigh. Patient is status post surgical plate and multiple screw fixation of the proximal to distal femur. Status post knee replacement. Comminuted and slightly impacted distal femoral fracture adjacent to the knee prosthesis. Vascular calcifications. Diffuse soft tissue edema. IMPRESSION: 1. Status post knee replacement. Surgical plate and screw fixation of the right femur with impacted and slightly comminuted distal femoral fracture slightly cephalad to the knee replacement. No significant bridging callus at this time. Electronically Signed   By: Jasmine Pang M.D.   On: 03/29/2018 17:14    Telemetry    03/31/18 Atrial fibrillation - Personally Reviewed  ECG    No new tracing as of 03/31/18 - Personally  Reviewed  Cardiac Studies   Echocardiogram 03/30/18: Study Conclusions  - Left ventricle: Wall thickness was increased in a pattern of mild   LVH. Systolic function was mildly reduced. The estimated ejection   fraction was in the range of 45% to 50%. The study is not   technically sufficient to allow evaluation of LV diastolic   function. - Aortic valve: Given degree of LV dysfunction AS may be severe   despite lower gradients DVI .36. There was trivial regurgitation.   Valve area (VTI): 1.49 cm^2. Valve area (Vmax): 1.6 cm^2. Valve   area (Vmean): 1.64 cm^2. - Mitral valve: Moderately calcified annulus. Mildly thickened   leaflets . Valve area by continuity equation (using LVOT flow):   2.69 cm^2. - Left atrium: The atrium was mildly dilated. - Atrial septum: No defect or patent foramen ovale was identified. - Tricuspid valve: There was moderate regurgitation. - Pulmonary arteries: PA peak pressure: 71 mm Hg (S).  Patient Profile     72 y.o. female with a hx of atrial fibrillation, s/p ablation of aflutter on chronic eliquis, CAD s/p CABG 2008 (LIMA-LAD, SVG to  OM), ischemic cardiomyopathy with EF of45-50%, most recent cath with patent grafts (2010), lymphedema and lower extremity ulcers due to venous stasis who is being seen today for the evaluation ofatrial fibrillation with RVR and acute on chronic systolic and diastolic heart failureat the request of Dr. Jacqulyn Bath.  Assessment & Plan    1.  Atrial fibrillation with RVR: -Remains in AF with rates in the low 90-100's  -Amiodarone gtt stopped secondary to low BP>>>asymptomatic>>will need to restart given stable BP and unable to tolerate PO medications -Metoprolol increased to 12.5mg  twice daily>>BP stable 111/47>123/56>111/41 -Pt had difficulty swallowing secondary to oversedation with pain medication>>>Eliquis changed to high dose Lovenox for now -Once able to tolerate consistent PO medication, restart Eliquis. There is concern  about compliance with BID anticoagulation however in the setting of acute DVT she will need to take meds BID and can consider changing to Xarelto 20mg  QD once DVT stabilized    2. Ischemic cardiomyopathy with acute systolic heart failure exacerbation: -CXR with pulmonary edema BNP elevated at 417.1 -Likely in the setting of no diuretic since hospital discharge -IV Lasix increased to 80mg  q8H with good diuresis -I&O, net negative 2.9L since admission  -Creatinine stable at 0.63  3.  CAD s/p CABG with elevated troponin: -Troponin, 0.15 -No anginal symptoms with no plans for ischemic work-up at this time  4.  Pulmonary embolism: -Chest CT with possible subsegmental PE likely in the setting of leg fracture and surgery while anticoagulation held and only taking Eliquis once per day  -On supplemental O2 with saturations stable   -Continue high dose Lovenox SQ  5. Acute DVT: -Per LE [place] 03/30/18, evidence of acute DVT in the right and left posterior tibial veins  -Continue with high dose Lovenox SQ>>>then will transition to PO anticoagulation.  6. Hypokalemia: -K+ today 3.1 -Replaced per primary team     Signed, Georgie Chard NP-C [facility] Pager: [phone] 03/31/2018, 8:09 AM     For questions or updates, please contact   Please consult [email or web address] for contact info under Cardiology/STEMI.

## 2018-03-31 NOTE — Progress Notes (Signed)
Pharmacy Antibiotic Note  Lise Aueroby R Ran is a 72 y.o. female admitted on 03/29/2018 with sepsis.  Pharmacy has been consulted for vanc/Zosyn dosing.  Plan: 1) Zosyn 3.375g IV q8 (extended interval infusion) 2) Vancomycin 2g x 1 then 1g IV q12 - goal AUC 400-500 3) Monitor renal function, cultures  Height: 5\' 6"  (167.6 cm) Weight: 267 lb 10.2 oz (121.4 kg) IBW/kg (Calculated) : 59.3  Temp (24hrs), Avg:100.8 F (38.2 C), Min:98.7 F (37.1 C), Max:103 F (39.4 C)  Recent Labs  Lab 03/29/18 1110 03/30/18 0326 03/30/18 1747 03/31/18 0333  WBC 4.3 4.4 12.0* 10.5  CREATININE 0.56 0.53 0.64 0.63    Estimated Creatinine Clearance: 81.9 mL/min (by C-G formula based on SCr of 0.63 mg/dL).    Allergies  Allergen Reactions  . Altace [Ramipril]     Listed on MAR  . Biaxin [Clarithromycin]     Listed on MAR  . Ciprofloxacin     Listed on MAR  . Latex     Listed on MAR  . Reopro [Abciximab]     Listed on MAR  . Simvastatin     Listed on MAR  . Tape     (adhesives) [facility]     Thank you for allowing pharmacy to be a part of this patient's care.  Berkley HarveyLegge, Renan Danese Marshall 03/31/2018 8:37 AM

## 2018-03-31 NOTE — Progress Notes (Addendum)
[facility] Progress Note  Patient: Hannah Elliott MWN:[id number]   PCP: Patient, No Pcp Per DOB: 09-25-1945   DOA: 03/29/2018   DOS: 03/31/2018   Date of Service: the patient was seen and examined on 03/31/2018  Subjective: Patient is more awake as compared to yesterday.  No nausea no vomiting overnight.  Able to follow command.  Complains about having pain in her bottom.  Breathing is okay.  No chest pain.  Brief hospital course: Pt. With complexmedical history significantfor recent hospitalization for [facility] in addition to R femoral and ankle fx s/p ORIF, CAD s/p CABG, atrial fibrillation on eliquis, heart failure, chronic lower extremity wounds, and several other medical problems presenting with worsening shortness of breath due to suspected heart failure exacerbation as well as atrial fibrillation with RVR and possible PE. Patient was at [facility] for rehabilitation [facility], patient was supposed to go for a follow-up appointment with [facility] and in route she complained of shortness of breath with tachycardia and EMS was called and patient was brought to this long hospital. Currently further plan is continuing rate control and treatment for CHF.  Assessment and Plan: 1.  Acute hypoxic respiratory failure. Acute on chronic diastolic and systolic CHF. A. fib with RVR. Hypotension. Cardiology consulted. Patient started on IV Lasix every 8 hours. We will monitor renal function. - 3 L so far. Shortness of breath is currently improving. Also started on IV amiodarone infusion.  Rate appears to be reasonably controlled overnight. We will monitor recommendation from cardiology. Patient on Eliquis for anticoagulation.  2.  Acute bilateral lower extremity DVT and pulmonary embolism. Scan of the chest on admission is positive for right upper lobe PE. Although the study was inadequate. However, her lower extremity is positive for bilateral  DVT which are acute. Patient is on Eliquis although she reports that at home she was taking it only once a day. [facility] between 03/04/2018 and 03/11/2018 and from there has been discharged to [facility] where she has been to this admission. I am presuming that the patient was getting her Eliquis twice a day during this whole length of stay. With this I suspect that patient should be switched to another anticoagulant. For now patient is been lethargic and therefore we will switch her to Lovenox but ideally would switch her to Xarelto which would also improve patient's compliance since she was taking Eliquis only once a day as it was too much for her to take. Currently no indication for thrombolysis. Discussed with case management and they informed me that cost of Xarelto versus Eliquis would be same.  3.  Sepsis. With fever tachycardia and hypotension sepsis cannot be ruled out. Although I suspect that her fever is coming from bilateral acute DVTs. Test was negative for any acute infiltrate although I will repeat another chest x-ray since the patient was more lethargic yesterday to rule out aspiration. [facility], will check urine culture. Blood cultures were positive therefore Pseudomonas which was pansensitive, will recheck blood cultures. Currently empirically I will start the patient on IV vancomycin and Zosyn. Monitor cultures.  4. Acute encephalopathy likely toxic due to medication. Patient is currently drowsy and lethargic and nonresponsive but withdraws to painful stimuli. CT head unremarkable ABG unremarkable. Patient received scheduled OxyContin which I think is the cause for encephalopathy. Discontinue scheduled narcotics and continue only PRN medications. Attempt to advance the diet today.  5.  Right femoral ankle fracture. S/P ORIF 03/06/2018 at [facility]. Cherlynn Polo still in place admission.  Orthopedic consulted,  staples removed nonweightbearing to right lower extremity. Patient will follow up with [facility] on discharge. Ankle pin still in place.  6.  Chronic bilateral lower extremity wounds. Full-thickness ulceration of the left posterior calf, right lower exam to surgical wound. Wound care consulted, silicone foam to the posterior left calf wound and medial malleolar wound change every 3 days and as needed. Posterior calf wound painted with Betadine daily allowed to air dry cover with silicone foam.  7.  CAD S/P CABG. Elevated troponin. Cardiology following. Likely demand ischemia. On aspirin-currently on hold due to drowsiness  8.  chronic anemia. H&H stable. Monitor.  9.  Hypokalemia. Replacing.  10.  Disposition. Patient is actually from [facility].  PT OT will be consulted.  Diet: cardiac diet DVT Prophylaxis: on therapeutic anticoagulation.  Advance goals of care discussion: full code  Family Communication: family was present at bedside, at the time of interview. The pt provided permission to discuss medical plan with the family. Opportunity was given to ask question and all questions were answered satisfactorily.   Disposition:  Discharge to be determined.  Consultants: cardiology  Procedures: Echocardiogram   Antibiotics: Anti-infectives (From admission, onward)   Start     Dose/Rate Route Frequency Ordered Stop   03/31/18 2200  vancomycin (VANCOCIN) IVPB 1000 mg/200 mL premix     1,000 mg 200 mL/hr over 60 Minutes Intravenous Every 12 hours 03/31/18 0840     03/31/18 1800  piperacillin-tazobactam (ZOSYN) IVPB 3.375 g     3.375 g 12.5 mL/hr over 240 Minutes Intravenous Every 8 hours 03/31/18 0840     03/31/18 0900  vancomycin (VANCOCIN) 2,000 mg in sodium chloride 0.9 % 500 mL IVPB     2,000 mg 250 mL/hr over 120 Minutes Intravenous  Once 03/31/18 0840     03/31/18 0830  piperacillin-tazobactam (ZOSYN) IVPB 3.375 g  Status:  Discontinued     3.375 g 100  mL/hr over 30 Minutes Intravenous  Once 03/31/18 0826 03/31/18 0829   03/31/18 0830  vancomycin (VANCOCIN) IVPB 1000 mg/200 mL premix  Status:  Discontinued     1,000 mg 200 mL/hr over 60 Minutes Intravenous  Once 03/31/18 0826 03/31/18 0830   03/31/18 0830  piperacillin-tazobactam (ZOSYN) IVPB 3.375 g     3.375 g 12.5 mL/hr over 240 Minutes Intravenous  Once 03/31/18 0829         Objective: Physical Exam: Vitals:   03/30/18 2000 03/30/18 2242 03/31/18 0000 03/31/18 0326  BP: (!) 123/56  (!) 111/47   Pulse: (!) 111 (!) 109 (!) 106   Resp: (!) 23 13 (!) 29   Temp: (!) 103 F (39.4 C) (!) 100.4 F (38 C) (!) 100.7 F (38.2 C) 98.7 F (37.1 C)  TempSrc: Oral Oral Oral Oral  SpO2: 94% 99% 100%   Weight:      Height:        Intake/Output Summary (Last 24 hours) at 03/31/2018 0849 Last data filed at 03/31/2018 0447 Gross per 24 hour  Intake 271.45 ml  Output 650 ml  Net -378.55 ml   Filed Weights   03/29/18 1820 03/29/18 1856  Weight: 65.4 kg (144 lb 2.9 oz) 121.4 kg (267 lb 10.2 oz)   General: Alert, Awake and Oriented to Time, Place and Person. Appear in moderate distress, affect appropriate Eyes: PERRL, Conjunctiva normal ENT: Oral Mucosa clear dry. Neck: difficult to  assess JVD, no Abnormal Mass Or lumps Cardiovascular: S1 and S2 Present, aortic systolic  Murmur, Peripheral Pulses Present Respiratory: normal respiratory effort, Bilateral Air entry equal and Decreased, no use of accessory muscle, bilateral basal Crackles, no wheezes Abdomen: Bowel Sound present, Soft and no tenderness, no hernia Skin: no redness, no Rash, no induration, chronic leg ulcers, wrapped Extremities: bilateral  Pedal edema, positive calf tenderness Neurologic: Grossly no focal neuro deficit. Bilaterally Equal motor strength  Data Reviewed: CBC: Recent Labs  Lab 03/29/18 1110 03/30/18 0326 03/30/18 1747 03/31/18 0333  WBC 4.3 4.4 12.0* 10.5  NEUTROABS 3.0  --  10.1*  --   HGB 9.6* 8.7*  9.7* 8.9*  HCT 31.5* 28.8* 31.7* 29.4*  MCV 98.7 98.3 98.1 99.0  PLT 236 220 256 215   Basic Metabolic Panel: Recent Labs  Lab 03/29/18 1110 03/30/18 0326 03/30/18 1747 03/31/18 0333  NA 142 140 141 143  K 4.0 3.5 3.5 3.1*  CL 100 95* 92* 95*  CO2 32 36* 34* 36*  GLUCOSE 139* 110* 127* 124*  BUN 18 15 14 15   CREATININE 0.56 0.53 0.64 0.63  CALCIUM 9.7 9.3 9.5 9.2  MG  --  1.9  --  1.9    Liver Function Tests: Recent Labs  Lab 03/29/18 1110 03/30/18 0326 03/30/18 1747  AST 30 27 31   ALT 13 11 13   ALKPHOS 81 71 78  BILITOT 2.2* 1.8* 2.2*  PROT 7.5 6.5 7.3  ALBUMIN 2.5* 2.3* 2.5*   No results for input(s): LIPASE, AMYLASE in the last 168 hours. No results for input(s): AMMONIA in the last 168 hours. Coagulation Profile: Recent Labs  Lab 03/31/18 0333  INR 3.90   Cardiac Enzymes: No results for input(s): CKTOTAL, CKMB, CKMBINDEX, TROPONINI in the last 168 hours. BNP (last 3 results) No results for input(s): PROBNP in the last 8760 hours. CBG: Recent Labs  Lab 03/30/18 1639  GLUCAP 119*   Studies: Ct Head Wo Contrast  Result Date: 03/30/2018 CLINICAL DATA:  72 y/o  F; altered mental status. EXAM: CT HEAD WITHOUT CONTRAST TECHNIQUE: Contiguous axial images were obtained from the base of the skull through the vertex without intravenous contrast. COMPARISON:  None. FINDINGS: Brain: No evidence of acute infarction, hemorrhage, hydrocephalus, extra-axial collection or mass lesion/mass effect. Mild chronic microvascular ischemic changes and volume loss of the brain. Vascular: Calcific atherosclerosis of carotid siphons. No hyperdense vessel identified. Skull: Normal. Negative for fracture or focal lesion. Sinuses/Orbits: No acute finding. Other: None. IMPRESSION: 1. No acute intracranial abnormality identified. 2. Mild chronic microvascular ischemic changes and volume loss of the brain for age. Electronically Signed   By: Mitzi Hansen M.D.   On: 03/30/2018  17:35    Scheduled Meds: . chlorhexidine  15 mL Mouth Rinse BID  . cholecalciferol  1,000 Units Oral Daily  . enoxaparin (LOVENOX) injection  120 mg Subcutaneous Q12H  . furosemide  80 mg Intravenous Q8H  . hydrogen peroxide 1/2 strength irrigation   Irrigation Once  . magnesium oxide  400 mg Oral BID  . mouth rinse  15 mL Mouth Rinse q12n4p  . metoprolol tartrate  12.5 mg Oral BID  . midodrine  10 mg Oral TID WC   Continuous Infusions: . amiodarone Stopped (03/31/18 0109)  . piperacillin-tazobactam (ZOSYN)  IV    . piperacillin-tazobactam (ZOSYN)  IV    . potassium chloride    . vancomycin    . vancomycin     PRN Meds: acetaminophen **OR** acetaminophen, oxyCODONE,  polyethylene glycol, simethicone  Time spent: The patient is critically ill with multiple organ systems failure and requires high complexity decision making for assessment and support, frequent evaluation and titration of therapies. Critical Care Time devoted to patient care services described in this note is 35 minutes   Author: Lynden Oxford, MD [facility] Pager: [phone] 03/31/2018 8:49 AM  If 7PM-7AM, please contact night-coverage at [email or web address], password [facility]

## 2018-04-01 DIAGNOSIS — I060 Rheumatic aortic stenosis: Secondary | ICD-10-CM

## 2018-04-01 DIAGNOSIS — I5020 Unspecified systolic (congestive) heart failure: Secondary | ICD-10-CM

## 2018-04-01 LAB — BASIC METABOLIC PANEL
ANION GAP: 12 (ref 5–15)
BUN: 20 mg/dL (ref 8–23)
CHLORIDE: 93 mmol/L — AB (ref 98–111)
CO2: 34 mmol/L — AB (ref 22–32)
CREATININE: 0.61 mg/dL (ref 0.44–1.00)
Calcium: 8.9 mg/dL (ref 8.9–10.3)
GFR calc non Af Amer: >60 mL/min (ref >60)
Glucose, Bld: 112 mg/dL — ABNORMAL HIGH (ref 70–99)
POTASSIUM: 2.9 mmol/L — AB (ref 3.5–5.1)
SODIUM: 139 mmol/L (ref 135–145)

## 2018-04-01 LAB — POTASSIUM: Potassium: 2.7 mmol/L — CL (ref 3.5–5.1)

## 2018-04-01 LAB — CBC
HEMATOCRIT: 30.2 % — AB (ref 36.0–46.0)
HEMOGLOBIN: 9.1 g/dL — AB (ref 12.0–15.0)
MCH: 29.6 pg (ref 26.0–34.0)
MCHC: 30.1 g/dL (ref 30.0–36.0)
MCV: 98.4 fL (ref 78.0–100.0)
Platelets: 247 10*3/uL (ref 150–400)
RBC: 3.07 MIL/uL — AB (ref 3.87–5.11)
RDW: 20.3 % — ABNORMAL HIGH (ref 11.5–15.5)
WBC: 11.9 10*3/uL — AB (ref 4.0–10.5)

## 2018-04-01 MED ORDER — POTASSIUM CHLORIDE CRYS ER 20 MEQ PO TBCR
40.0000 meq | EXTENDED_RELEASE_TABLET | Freq: Two times a day (BID) | ORAL | Status: AC
  Administered 2018-04-01 – 2018-04-02 (×2): 40 meq via ORAL
  Filled 2018-04-01 (×2): qty 2

## 2018-04-01 MED ORDER — POTASSIUM CHLORIDE 10 MEQ/100ML IV SOLN
10.0000 meq | INTRAVENOUS | Status: AC
  Administered 2018-04-01 – 2018-04-02 (×2): 10 meq via INTRAVENOUS
  Filled 2018-04-01 (×2): qty 100

## 2018-04-01 NOTE — Progress Notes (Signed)
CRITICAL VALUE STICKER  CRITICAL VALUE: K- 2.7  RECEIVER (on-site recipient of call): Garrel Ridgel. Juel Ripley RN  DATE & TIME NOTIFIED: 2154  MESSENGER (representative from lab):J. Mosely  MD NOTIFIED: X. Blount   TIME OF NOTIFICATION:2154  RESPONSE: Orders received @2159 

## 2018-04-01 NOTE — Progress Notes (Signed)
Pt has an order to have stables removed, while assessing the pt's staples. The staples are noted to be deeply embedded into the Pt's skin. Paged the Ortho Tech at [phone] to come and assess, no return paged at this time. I will pass my findings onto the oncoming RN Selena BattenKim. I will continue to monitor.

## 2018-04-01 NOTE — Progress Notes (Signed)
Progress Note  Patient Name: Hannah Elliott Date of Encounter: 04/01/2018  Primary Cardiologist: [facility], new to Dr. Duke Salvia  Subjective   Pain all over no palpitations   Inpatient Medications    Scheduled Meds: . chlorhexidine  15 mL Mouth Rinse BID  . cholecalciferol  1,000 Units Oral Daily  . enoxaparin (LOVENOX) injection  120 mg Subcutaneous Q12H  . feeding supplement (ENSURE ENLIVE)  237 mL Oral Q24H  . feeding supplement (PRO-STAT SUGAR FREE 64)  30 mL Oral BID  . furosemide  80 mg Intravenous Q8H  . hydrogen peroxide 1/2 strength irrigation   Irrigation Once  . magnesium oxide  400 mg Oral BID  . mouth rinse  15 mL Mouth Rinse q12n4p  . metoprolol tartrate  12.5 mg Oral BID  . midodrine  10 mg Oral TID WC  . multivitamin with minerals  1 tablet Oral Daily   Continuous Infusions: . sodium chloride Stopped (03/31/18 1325)  . sodium chloride Stopped (03/31/18 1226)  . amiodarone Stopped (03/31/18 1017)  . piperacillin-tazobactam (ZOSYN)  IV Stopped (04/01/18 0657)  . vancomycin Stopped (03/31/18 2308)   PRN Meds: sodium chloride, sodium chloride, acetaminophen **OR** acetaminophen, oxyCODONE, polyethylene glycol, simethicone   Vital Signs    Vitals:   04/01/18 0400 04/01/18 0500 04/01/18 0600 04/01/18 0700  BP: (!) 112/46 (!) 114/50 (!) 108/37 (!) 99/49  Pulse: (!) 103 (!) 103 (!) 102 (!) 109  Resp: 19 19 (!) 24 (!) 28  Temp:      TempSrc:      SpO2: 100% 100% 100% 94%  Weight:      Height:        Intake/Output Summary (Last 24 hours) at 04/01/2018 0816 Last data filed at 03/31/2018 1700 Gross per 24 hour  Intake 1030.45 ml  Output 250 ml  Net 780.45 ml   Filed Weights   03/29/18 1820 03/29/18 1856  Weight: 144 lb 2.9 oz (65.4 kg) 267 lb 10.2 oz (121.4 kg)    Physical Exam   Affect appropriate Chronically ill obese white female  HEENT: normal Neck supple with no adenopathy JVP normal no bruits no thyromegaly Lungs clear with no  wheezing and good diaphragmatic motion Heart:  S1/S2 AS  murmur, no rub, gallop or click PMI normal Abdomen: benighn, BS positve, no tenderness, no AAA no bruit.  No HSM or HJR Full thickness LE wounds with dressings in place Plus 2 LE edema  Neuro non-focal Skin warm and dry No muscular weakness   Labs    Chemistry Recent Labs  Lab 03/29/18 1110 03/30/18 0326 03/30/18 1747 03/31/18 0333 04/01/18 0346  NA 142 140 141 143 139  K 4.0 3.5 3.5 3.1* 2.9*  CL 100 95* 92* 95* 93*  CO2 32 36* 34* 36* 34*  GLUCOSE 139* 110* 127* 124* 112*  BUN 18 15 14 15 20   CREATININE 0.56 0.53 0.64 0.63 0.61  CALCIUM 9.7 9.3 9.5 9.2 8.9  PROT 7.5 6.5 7.3  --   --   ALBUMIN 2.5* 2.3* 2.5*  --   --   AST 30 27 31   --   --   ALT 13 11 13   --   --   ALKPHOS 81 71 78  --   --   BILITOT 2.2* 1.8* 2.2*  --   --   GFRNONAA >60 >60 >60 >60 >60  GFRAA >60 >60 >60 >60 >60  ANIONGAP 10 9 15 12  12  Hematology Recent Labs  Lab 03/30/18 1747 03/31/18 0333 04/01/18 0346  WBC 12.0* 10.5 11.9*  RBC 3.23* 2.97* 3.07*  HGB 9.7* 8.9* 9.1*  HCT 31.7* 29.4* 30.2*  MCV 98.1 99.0 98.4  MCH 30.0 30.0 29.6  MCHC 30.6 30.3 30.1  RDW 20.7* 20.6* 20.3*  PLT 256 215 247    Cardiac EnzymesNo results for input(s): TROPONINI in the last 168 hours.  Recent Labs  Lab 03/29/18 1114 03/29/18 1501  TROPIPOC 0.15* 0.14*     BNP Recent Labs  Lab 03/29/18 1110  BNP 417.1*     DDimer No results for input(s): DDIMER in the last 168 hours.   Radiology    Ct Head Wo Contrast  Result Date: 03/30/2018 CLINICAL DATA:  72 y/o  F; altered mental status. EXAM: CT HEAD WITHOUT CONTRAST TECHNIQUE: Contiguous axial images were obtained from the base of the skull through the vertex without intravenous contrast. COMPARISON:  None. FINDINGS: Brain: No evidence of acute infarction, hemorrhage, hydrocephalus, extra-axial collection or mass lesion/mass effect. Mild chronic microvascular ischemic changes and volume loss  of the brain. Vascular: Calcific atherosclerosis of carotid siphons. No hyperdense vessel identified. Skull: Normal. Negative for fracture or focal lesion. Sinuses/Orbits: No acute finding. Other: None. IMPRESSION: 1. No acute intracranial abnormality identified. 2. Mild chronic microvascular ischemic changes and volume loss of the brain for age. Electronically Signed   By: Mitzi Hansen M.D.   On: 03/30/2018 17:35   Dg Chest Port 1 View  Result Date: 03/31/2018 CLINICAL DATA:  Shortness of breath. EXAM: PORTABLE CHEST 1 VIEW COMPARISON:  Radiographs of March 29, 2018. FINDINGS: Stable cardiomegaly is noted with central pulmonary vascular congestion. No pneumothorax is noted. Interval development of moderate left pleural effusion is noted. Probable bilateral pulmonary edema is noted. Small right pleural effusion is noted. Probable bibasilar atelectasis and edema is noted. Bony thorax is unremarkable. Sternotomy wires are noted. IMPRESSION: Stable cardiomegaly. Probable bilateral pulmonary edema is noted with interval development of bilateral pleural effusions, left greater than right. Electronically Signed   By: Lupita Raider, M.D.   On: 03/31/2018 09:04    Telemetry    03/31/18 Atrial fibrillation - Personally Reviewed  ECG    No new tracing as of 03/31/18 - Personally Reviewed  Cardiac Studies   Echocardiogram 03/30/18: Study Conclusions  - Left ventricle: Wall thickness was increased in a pattern of mild   LVH. Systolic function was mildly reduced. The estimated ejection   fraction was in the range of 45% to 50%. The study is not   technically sufficient to allow evaluation of LV diastolic   function. - Aortic valve: Given degree of LV dysfunction AS may be severe   despite lower gradients DVI .36. There was trivial regurgitation.   Valve area (VTI): 1.49 cm^2. Valve area (Vmax): 1.6 cm^2. Valve   area (Vmean): 1.64 cm^2. - Mitral valve: Moderately calcified annulus.  Mildly thickened   leaflets . Valve area by continuity equation (using LVOT flow):   2.69 cm^2. - Left atrium: The atrium was mildly dilated. - Atrial septum: No defect or patent foramen ovale was identified. - Tricuspid valve: There was moderate regurgitation. - Pulmonary arteries: PA peak pressure: 71 mm Hg (S).  Patient Profile     72 y.o. female with a hx of atrial fibrillation, s/p ablation of aflutter on chronic eliquis, CAD s/p CABG 2008 (LIMA-LAD, SVG to OM), ischemic cardiomyopathy with EF of45-50%, most recent cath with patent grafts (2010), lymphedema and lower extremity  ulcers due to venous stasis who is being seen today for the evaluation ofatrial fibrillation with RVR and acute on chronic systolic and diastolic heart failureat the request of Dr. Jacqulyn BathLong.  Assessment & Plan    1.  Atrial fibrillation with RVR: -Remains in AF with rates in the low 90-100's  continue Lopresor Start xarelto per primary service   2. Ischemic cardiomyopathy with acute systolic heart failure exacerbation: Stable still with inspiratory crackles on exam diuretic held due to low BP I/O positive may need dose tomorrow  3.  CAD s/p CABG with elevated troponin: -Troponin, 0.15 -No anginal symptoms with no plans for ischemic work-up at this time  4.  Pulmonary embolism: -Chest CT with possible subsegmental PE likely in the setting of leg fracture and surgery while anticoagulation held and only taking Eliquis once per day  -On supplemental O2 with saturations stable   -Start Xarelto per primary service   5. Acute DVT: -Per LE US 03/30/18, evidence of acute DVT in the right and left posterior tibial veins  -Continue with high dose Lovenox SQ>>>then will transition to PO anticoagulation.  6. Hypokalemia: -K+ today 2.9  -Replaced per primary team    Charlton HawsPeter Kaesha Kirsch

## 2018-04-01 NOTE — Progress Notes (Addendum)
Staples (32) removed from rt leg  per Dr. Barrie FolkUgah whom assessed leg at bedside with writer. Area cleansed with betadine prior to removal. Patient tolerated well and dressing applied.

## 2018-04-01 NOTE — Care Management Note (Signed)
Case Management Note  Patient Details  Name: Hannah Elliott MRN: [id number] Date of Birth: 04-15-1946  Subjective/Objective:   afib with RVR, acute respiratory failure, CHF, DVT, PE                 Action/Plan: Received message from [facility] states they have offered a bed and have bed available if pt was stable for dc to LTAC this weekend. Notified attending.   Expected Discharge Date:                Expected Discharge Plan:  Long Term Acute Care (LTAC)  In-House Referral:  NA  Discharge planning Services  CM Consult  Post Acute Care Choice:  Long Term Acute Care (LTAC) Choice offered to:  Adult Children  DME Arranged:  N/A DME Agency:  NA  HH Arranged:  NA HH Agency:  NA  Status of Service:  Completed, signed off  If discussed at Long Length of Stay Meetings, dates discussed:    Additional Comments:  Elliot CousinShavis, Antonetta Clanton Ellen, RN 04/01/2018, 9:38 AM

## 2018-04-01 NOTE — Progress Notes (Addendum)
[facility] Progress Note  Patient: Hannah Elliott:[id number]   PCP: Patient, No Pcp Per DOB: 08/02/1946   DOA: 03/29/2018   DOS: 04/01/2018   Date of Service: the patient was seen and examined on 04/01/2018  Subjective: Patient is more awake as compared to yesterday.  Still drowsy and weak looking no nausea no vomiting overnight.  Able to follow command.  Complains about having pain in her bottom.  Breathing is okay.  No chest pain.  Brief hospital course: Pt. With complexmedical history significantfor recent hospitalization for pseudomonas bacteremia at [facility] in addition to R femoral and ankle fx s/p ORIF, CAD s/p CABG, atrial fibrillation on eliquis, heart failure, chronic lower extremity wounds, and several other medical problems presenting with worsening shortness of breath due to suspected heart failure exacerbation as well as atrial fibrillation with RVR and possible PE. Patient was at [facility] for rehabilitation after her hospitalization at [facility], patient was supposed to go for a follow-up appointment with [facility] and in route she complained of shortness of breath with tachycardia and EMS was called and patient was brought to this long hospital. Currently further plan is continuing rate control DVT treatments and treatment for CHF.  Assessment and Plan: 1.  Acute hypoxic respiratory failure. Acute on chronic diastolic and systolic CHF. A. fib with RVR. Hypotension. Cardiology consulted. Patient started on IV Lasix every 8 hours. We will monitor renal function. - 3 L so far. Shortness of breath is currently improving. Also started on IV amiodarone infusion.  Rate appears to be reasonably controlled overnight. We will monitor recommendation from cardiology. Patient on Lovenox  2.  Acute bilateral lower extremity DVT and pulmonary embolism. Scan of the chest on admission is positive for right upper lobe PE. Although the study was inadequate. However, her lower extremity is  positive for bilateral DVT which are acute. Patient was on Eliquis at home .she was taking it only once a day instead of twice a day For now patient will remain on Lovenox which was switched from Eliquis due to her being lethargic and not swallowing properly 3.  Sepsis. With fever tachycardia and hypotension sepsis cannot be ruled out. Although I suspect that her fever is coming from bilateral acute DVTs. Test was negative for any acute infiltrate although I will repeat another chest x-ray since the patient was more lethargic yesterday to rule out aspiration. Patient had a Foley catheter at [facility], will check urine culture. Blood cultures were positive therefore Pseudomonas which was pansensitive, will recheck blood cultures. Currently empirically I will start the patient on IV vancomycin and Zosyn. Monitor cultures.  4. Acute encephalopathy likely toxic due to medication. Patient is currently drowsy and more responsive CT head unremarkable ABG unremarkable. Patient received scheduled OxyContin which I think is the cause for encephalopathy. Discontinue scheduled narcotics and continue only PRN medications. Attempt to advance the diet today.  5. Right femoral ankle fracture. S/P ORIF 03/06/2018 at [facility]. Cherlynn Polo still in place since admission.  Orthopedic recommended to remove  removed nonweightbearing to right lower extremity. Patient will follow up with [facility] on discharge. Ankle pin still in place.  6.  Chronic bilateral lower extremity wounds. Full-thickness ulceration of the left posterior calf, right lower exam to surgical wound. Wound care consulted, silicone foam to the posterior left calf wound and medial malleolar wound change every 3 days and as needed. Posterior calf wound painted with Betadine daily allowed to air dry cover with silicone foam.  7.  CAD S/P CABG. Elevated troponin. Cardiology following. Likely demand ischemia. On  aspirin-currently on hold due to drowsiness  8.  chronic anemia. H&H stable. Monitor.  9.  Hypokalemia. Replacing.  10.  Disposition. Patient is actually from [facility].  PT OT will be consulted.  Diet: cardiac diet DVT Prophylaxis: on therapeutic anticoagulation.  Advance goals of care discussion: full code  Family Communication: family was present at bedside, at the time of interview. The pt provided permission to discuss medical plan with the family. Opportunity was given to ask question and all questions were answered satisfactorily.   Disposition:  Discharge to be determined.  Consultants: cardiology  Procedures: Echocardiogram   Antibiotics: Anti-infectives (From admission, onward)   Start     Dose/Rate Route Frequency Ordered Stop   03/31/18 2200  vancomycin (VANCOCIN) IVPB 1000 mg/200 mL premix     1,000 mg 200 mL/hr over 60 Minutes Intravenous Every 12 hours 03/31/18 0840     03/31/18 1800  piperacillin-tazobactam (ZOSYN) IVPB 3.375 g     3.375 g 12.5 mL/hr over 240 Minutes Intravenous Every 8 hours 03/31/18 0840     03/31/18 0900  vancomycin (VANCOCIN) 2,000 mg in sodium chloride 0.9 % 500 mL IVPB     2,000 mg 250 mL/hr over 120 Minutes Intravenous  Once 03/31/18 0840 03/31/18 1501   03/31/18 0830  piperacillin-tazobactam (ZOSYN) IVPB 3.375 g  Status:  Discontinued     3.375 g 100 mL/hr over 30 Minutes Intravenous  Once 03/31/18 0826 03/31/18 0829   03/31/18 0830  vancomycin (VANCOCIN) IVPB 1000 mg/200 mL premix  Status:  Discontinued     1,000 mg 200 mL/hr over 60 Minutes Intravenous  Once 03/31/18 0826 03/31/18 0830   03/31/18 0830  piperacillin-tazobactam (ZOSYN) IVPB 3.375 g     3.375 g 12.5 mL/hr over 240 Minutes Intravenous  Once 03/31/18 0829 03/31/18 1319       Objective: Physical Exam: Vitals:   04/01/18 1021 04/01/18 1200 04/01/18 1400 04/01/18 1500  BP: (!) 102/44 (!) 122/50 (!) 113/49 113/74  Pulse: (!) 101 93 (!) 109 (!) 102  Resp:  (!) 23  (!) 22 (!) 27  Temp:      TempSrc:      SpO2:  98% (!) 80% 97%  Weight:      Height:        Intake/Output Summary (Last 24 hours) at 04/01/2018 1646 Last data filed at 04/01/2018 1500 Gross per 24 hour  Intake 437.93 ml  Output 600 ml  Net -162.07 ml   Filed Weights   03/29/18 1820 03/29/18 1856  Weight: 65.4 kg (144 lb 2.9 oz) 121.4 kg (267 lb 10.2 oz)   General: Alert, Awake and Oriented to Time, Place and Person. Appear in moderate distress, affect appropriate Eyes: PERRL, Conjunctiva normal ENT: Oral Mucosa clear dry. Neck: difficult to assess JVD, no Abnormal Mass Or lumps Cardiovascular: S1 and S2 Present, aortic systolic  Murmur, Peripheral Pulses Present Respiratory: normal respiratory effort, Bilateral Air entry equal and Decreased, no use of accessory muscle, bilateral basal Crackles, no wheezes Abdomen: Bowel Sound present, Soft and no tenderness, no hernia Skin: no redness, no Rash, no induration, chronic leg ulcers, wrapped surgical site on the right outer thigh has staples in place slightly red on around it but no weeping area no broken down areas.  We will remove her staples Extremities: bilateral  Pedal edema, positive calf tenderness Neurologic: Grossly no focal neuro deficit. Bilaterally Equal motor strength generally weak  Data Reviewed: CBC: Recent Labs  Lab 03/29/18 1110 03/30/18 0326 03/30/18 1747 03/31/18 0333 04/01/18 0346  WBC 4.3 4.4 12.0* 10.5 11.9*  NEUTROABS 3.0  --  10.1*  --   --   HGB 9.6* 8.7* 9.7* 8.9* 9.1*  HCT 31.5* 28.8* 31.7* 29.4* 30.2*  MCV 98.7 98.3 98.1 99.0 98.4  PLT 236 220 256 215 247   Basic Metabolic Panel: Recent Labs  Lab 03/29/18 1110 03/30/18 0326 03/30/18 1747 03/31/18 0333 04/01/18 0346  NA 142 140 141 143 139  K 4.0 3.5 3.5 3.1* 2.9*  CL 100 95* 92* 95* 93*  CO2 32 36* 34* 36* 34*  GLUCOSE 139* 110* 127* 124* 112*  BUN 18 15 14 15 20   CREATININE 0.56 0.53 0.64 0.63 0.61  CALCIUM 9.7 9.3 9.5 9.2 8.9    MG  --  1.9  --  1.9  --    GFR: Estimated Creatinine Clearance: 81.9 mL/min (by C-G formula based on SCr of 0.61 mg/dL). Liver Function Tests: Recent Labs  Lab 03/29/18 1110 03/30/18 0326 03/30/18 1747  AST 30 27 31   ALT 13 11 13   ALKPHOS 81 71 78  BILITOT 2.2* 1.8* 2.2*  PROT 7.5 6.5 7.3  ALBUMIN 2.5* 2.3* 2.5*   No results for input(s): LIPASE, AMYLASE in the last 168 hours. No results for input(s): AMMONIA in the last 168 hours. Coagulation Profile: Recent Labs  Lab 03/31/18 0333  INR 3.90   Cardiac Enzymes: No results for input(s): CKTOTAL, CKMB, CKMBINDEX, TROPONINI in the last 168 hours. BNP (last 3 results) No results for input(s): PROBNP in the last 8760 hours. HbA1C: No results for input(s): HGBA1C in the last 72 hours. CBG: Recent Labs  Lab 03/30/18 1639  GLUCAP 119*   Lipid Profile: No results for input(s): CHOL, HDL, LDLCALC, TRIG, CHOLHDL, LDLDIRECT in the last 72 hours. Thyroid Function Tests: Recent Labs    03/29/18 1850  TSH 5.807*   Anemia Panel: No results for input(s): VITAMINB12, FOLATE, FERRITIN, TIBC, IRON, RETICCTPCT in the last 72 hours. Urine analysis: No results found for: COLORURINE, APPEARANCEUR, LABSPEC, PHURINE, GLUCOSEU, HGBUR, BILIRUBINUR, KETONESUR, PROTEINUR, UROBILINOGEN, NITRITE, LEUKOCYTESUR Sepsis Labs: @LABRCNTIP (procalcitonin:4,lacticidven:4)  ) Recent Results (from the past 240 hour(s))  MRSA PCR Screening     Status: None   Collection Time: 03/29/18  6:42 PM  Result Value Ref Range Status   MRSA by PCR NEGATIVE NEGATIVE Final    Comment:        The GeneXpert MRSA Assay (FDA approved for NASAL specimens only), is one component of a comprehensive MRSA colonization surveillance program. It is not intended to diagnose MRSA infection nor to guide or monitor treatment for MRSA infections. Performed at [facility], [address] [place], [place] [address]   Culture, blood (x 2)      Status: None (Preliminary result)   Collection Time: 03/31/18  9:28 AM  Result Value Ref Range Status   Specimen Description   Final    BLOOD RIGHT HAND Performed at [facility], [address] [place], [place] [address]    Special Requests   Final    BOTTLES DRAWN AEROBIC AND ANAEROBIC Blood Culture adequate volume Performed at [facility], [address] [place], [place] [address]    Culture   Final    NO GROWTH 1 DAY Performed at [facility], [address] [place], [place] [address]    Report Status PENDING  Incomplete  Culture,  blood (x 2)     Status: None (Preliminary result)   Collection Time: 03/31/18  9:28 AM  Result Value Ref Range Status   Specimen Description   Final    BLOOD RIGHT ANTECUBITAL Performed at [facility], [address] [place], [place] [address]    Special Requests   Final    BOTTLES DRAWN AEROBIC AND ANAEROBIC Blood Culture adequate volume Performed at [facility], [address] [place], [place] [address]    Culture   Final    NO GROWTH 1 DAY Performed at [facility], [address], [place], [place] [address]    Report Status PENDING  Incomplete      Studies: No results found.  Scheduled Meds: . chlorhexidine  15 mL Mouth Rinse BID  . cholecalciferol  1,000 Units Oral Daily  . enoxaparin (LOVENOX) injection  120 mg Subcutaneous Q12H  . feeding supplement (ENSURE ENLIVE)  237 mL Oral Q24H  . feeding supplement (PRO-STAT SUGAR FREE 64)  30 mL Oral BID  . furosemide  80 mg Intravenous Q8H  . hydrogen peroxide 1/2 strength irrigation   Irrigation Once  . magnesium oxide  400 mg Oral BID  . mouth rinse  15 mL Mouth Rinse q12n4p  . metoprolol tartrate  12.5 mg Oral BID  . midodrine  10 mg Oral TID WC  . multivitamin with minerals  1 tablet Oral Daily    Continuous Infusions: . sodium chloride Stopped (03/31/18 1325)  . sodium chloride  Stopped (03/31/18 1226)  . amiodarone Stopped (03/31/18 1017)  . piperacillin-tazobactam (ZOSYN)  IV Stopped (04/01/18 1415)  . vancomycin Stopped (04/01/18 1117)     LOS: 3 days     Myrtie Neither, MD [facility]  To reach me or the doctor on call, go to: [email or web address] Password [facility]  04/01/2018, 4:48 PM

## 2018-04-02 LAB — BASIC METABOLIC PANEL
Anion gap: 14 (ref 5–15)
BUN: 22 mg/dL (ref 8–23)
CO2: 36 mmol/L — ABNORMAL HIGH (ref 22–32)
Calcium: 8.9 mg/dL (ref 8.9–10.3)
Chloride: 90 mmol/L — ABNORMAL LOW (ref 98–111)
Creatinine, Ser: 0.67 mg/dL (ref 0.44–1.00)
GFR calc Af Amer: >60 mL/min (ref >60)
GLUCOSE: 101 mg/dL — AB (ref 70–99)
Potassium: 3.1 mmol/L — ABNORMAL LOW (ref 3.5–5.1)
SODIUM: 140 mmol/L (ref 135–145)

## 2018-04-02 LAB — CBC
HCT: 32.6 % — ABNORMAL LOW (ref 36.0–46.0)
Hemoglobin: 9.9 g/dL — ABNORMAL LOW (ref 12.0–15.0)
MCH: 29.4 pg (ref 26.0–34.0)
MCHC: 30.4 g/dL (ref 30.0–36.0)
MCV: 96.7 fL (ref 78.0–100.0)
PLATELETS: 271 10*3/uL (ref 150–400)
RBC: 3.37 MIL/uL — ABNORMAL LOW (ref 3.87–5.11)
RDW: 19.8 % — AB (ref 11.5–15.5)
WBC: 11.4 10*3/uL — AB (ref 4.0–10.5)

## 2018-04-02 MED ORDER — OXYCODONE HCL 5 MG PO TABS
5.0000 mg | ORAL_TABLET | Freq: Once | ORAL | Status: AC
  Administered 2018-04-02: 5 mg via ORAL

## 2018-04-02 MED ORDER — FUROSEMIDE 10 MG/ML IJ SOLN: 80.0000 mg | Freq: Every day | INTRAMUSCULAR | Status: DC

## 2018-04-02 MED ORDER — RIVAROXABAN 15 MG PO TABS
15.0000 mg | ORAL_TABLET | Freq: Two times a day (BID) | ORAL | Status: DC
  Administered 2018-04-02 – 2018-04-03 (×4): 15 mg via ORAL
  Filled 2018-04-02 (×4): qty 1

## 2018-04-02 MED ORDER — POTASSIUM CHLORIDE CRYS ER 20 MEQ PO TBCR: 40.0000 meq | EXTENDED_RELEASE_TABLET | Freq: Once | ORAL | Status: DC

## 2018-04-02 MED ORDER — DOXYCYCLINE HYCLATE 100 MG PO TABS: 100.0000 mg | ORAL_TABLET | Freq: Two times a day (BID) | ORAL | Status: DC

## 2018-04-02 MED ORDER — POTASSIUM CHLORIDE CRYS ER 20 MEQ PO TBCR
40.0000 meq | EXTENDED_RELEASE_TABLET | Freq: Once | ORAL | Status: AC
  Administered 2018-04-02: 40 meq via ORAL
  Filled 2018-04-02: qty 2

## 2018-04-02 MED ORDER — ALBUMIN HUMAN 25 % IV SOLN
25.0000 g | Freq: Once | INTRAVENOUS | Status: AC
  Administered 2018-04-02: 25 g via INTRAVENOUS
  Filled 2018-04-02: qty 100
  Filled 2018-04-02: qty 50

## 2018-04-02 MED ORDER — RIVAROXABAN 20 MG PO TABS: 20.0000 mg | ORAL_TABLET | Freq: Every day | ORAL | Status: DC

## 2018-04-02 MED ORDER — FUROSEMIDE 10 MG/ML IJ SOLN
80.0000 mg | Freq: Once | INTRAMUSCULAR | Status: AC
  Administered 2018-04-02: 80 mg via INTRAVENOUS
  Filled 2018-04-02: qty 8

## 2018-04-02 MED ORDER — AMOXICILLIN-POT CLAVULANATE 500-125 MG PO TABS
1.0000 | ORAL_TABLET | Freq: Three times a day (TID) | ORAL | Status: DC
  Administered 2018-04-02 – 2018-04-03 (×4): 500 mg via ORAL
  Filled 2018-04-02 (×5): qty 1

## 2018-04-02 NOTE — Progress Notes (Signed)
[facility] Progress Note  Patient: Hannah Elliott:[id number]   PCP: Patient, No Pcp Per DOB: 13-Jul-1946   DOA: 03/29/2018   DOS: 04/02/2018   Date of Service: the patient was seen and examined on 04/02/2018  Subjective: Patient is awake, no nausea no vomiting.  Lethargic and somewhat slow to respond but follows command.  Brief hospital course: Pt. With complexmedical history significantfor recent hospitalization for pseudomonas bacteremia at [facility] in addition to R femoral and ankle fx s/p ORIF, CAD s/p CABG, atrial fibrillation on eliquis, heart failure, chronic lower extremity wounds, and several other medical problems presenting with worsening shortness of breath due to suspected heart failure exacerbation as well as atrial fibrillation with RVR and possible PE. Patient was at [facility] for rehabilitation after her hospitalization at [facility], patient was supposed to go for a follow-up appointment with [facility] and in route she complained of shortness of breath with tachycardia and EMS was called and patient was brought to this long hospital. Currently further plan is continuing rate control and treatment for CHF.  Assessment and Plan: 1.  Acute hypoxic respiratory failure. Acute on chronic diastolic and systolic CHF. A. fib with RVR. Hypotension. Cardiology consulted. Patient started on IV Lasix every 8 hours, per cardiology note Lasix on hold due to blood pressure.  Patient is still volume overloaded, will give IV albumin followed by IV Lasix. Patient is on aggressive IV diuresis until 04/02/2018 and still not diuresing briskly likely from hypoalbuminemia as well as hypoperfusion. Subjectively shortness of breath is currently improving. Also started on IV amiodarone infusion.  Completed and now on Lopressor for rate control. Rate appears to be uncontrolled. We will monitor recommendation from cardiology. Patient on Eliquis for anticoagulation prior to admission but only taking once a  day. Continue IV Lasix 80 mg daily will use IV albumin today.  2.  Acute bilateral lower extremity DVT and pulmonary embolism. Scan of the chest on admission is positive for right upper lobe PE. Although the study was inadequate. However, her lower extremity is positive for bilateral DVT which are acute. Patient is on Eliquis although she reports that at home she was taking it only once a day. Patient has been at [facility] between 03/04/2018 and 03/11/2018 and from there has been discharged to [facility] where she has been to this admission. I am presuming that the patient was getting her Eliquis twice a day during this whole length of stay. With this I suspect that patient should be switched to another anticoagulant. ideally would switch her to Xarelto which would also improve patient's compliance since she was taking Eliquis only once a day as it was too much for her to take. Pharmacy consulted for transition to Xarelto. Currently no indication for thrombolysis. Discussed with case management and they informed me that cost of Xarelto versus Eliquis would be same.  3.  Sepsis. With fever tachycardia and hypotension sepsis cannot be ruled out. Although I suspect that her fever is coming from bilateral acute DVTs. CT chest was negative for any acute infiltrate Blood cultures were positive therefore Pseudomonas which was pansensitive, blood cultures here are negative. empirically patient was started on IV vancomycin and Zosyn. MRSA PCR is negative. We will transition her to oral Augmentin.  4. Acute encephalopathy likely toxic due to medication. Patient is currently drowsy and lethargic and nonresponsive but withdraws to painful stimuli. CT head unremarkable ABG unremarkable. Patient received scheduled OxyContin which I think is the cause for encephalopathy. Discontinue scheduled narcotics and  continue only PRN medications. Currently resolved.  Monitor.  5. Right femoral ankle  fracture. S/P ORIF 03/06/2018 at [facility]. Cherlynn Polo still in place admission.  Orthopedic consulted, staples removed nonweightbearing to right lower extremity. Patient will follow up with [facility] on discharge. Ankle pin still in place.  6.  Chronic bilateral lower extremity wounds. Full-thickness ulceration of the left posterior calf, right lower exam to surgical wound. Wound care consulted, silicone foam to the posterior left calf wound and medial malleolar wound change every 3 days and as needed. Posterior calf wound painted with Betadine daily allowed to air dry cover with silicone foam.  7.  CAD S/P CABG. Elevated troponin. Cardiology following. Likely demand ischemia. On aspirin-currently on hold due to drowsiness  8.  chronic anemia. H&H stable. Monitor.  9.  Hypokalemia. Replacing.  10.  Disposition. Patient is actually from [facility].  PT OT will be consulted.  Diet: cardiac diet DVT Prophylaxis: on therapeutic anticoagulation.  Advance goals of care discussion: full code  Family Communication: no family was present at bedside, at the time of interview.  Last interaction with the family was on Friday.  Disposition:  Discharge to be determined.  Consultants: cardiology  Procedures: Echocardiogram   Antibiotics: Anti-infectives (From admission, onward)   Start     Dose/Rate Route Frequency Ordered Stop   03/31/18 2200  vancomycin (VANCOCIN) IVPB 1000 mg/200 mL premix     1,000 mg 200 mL/hr over 60 Minutes Intravenous Every 12 hours 03/31/18 0840     03/31/18 1800  piperacillin-tazobactam (ZOSYN) IVPB 3.375 g     3.375 g 12.5 mL/hr over 240 Minutes Intravenous Every 8 hours 03/31/18 0840     03/31/18 0900  vancomycin (VANCOCIN) 2,000 mg in sodium chloride 0.9 % 500 mL IVPB     2,000 mg 250 mL/hr over 120 Minutes Intravenous  Once 03/31/18 0840 03/31/18 1501   03/31/18 0830  piperacillin-tazobactam (ZOSYN) IVPB 3.375 g  Status:  Discontinued      3.375 g 100 mL/hr over 30 Minutes Intravenous  Once 03/31/18 0826 03/31/18 0829   03/31/18 0830  vancomycin (VANCOCIN) IVPB 1000 mg/200 mL premix  Status:  Discontinued     1,000 mg 200 mL/hr over 60 Minutes Intravenous  Once 03/31/18 0826 03/31/18 0830   03/31/18 0830  piperacillin-tazobactam (ZOSYN) IVPB 3.375 g     3.375 g 12.5 mL/hr over 240 Minutes Intravenous  Once 03/31/18 0829 03/31/18 1319       Objective: Physical Exam: Vitals:   04/02/18 0400 04/02/18 0500 04/02/18 0700 04/02/18 0800  BP: (!) 129/52 114/62 (!) 117/59 104/68  Pulse: (!) 107 (!) 105 (!) 104 (!) 117  Resp: (!) 25 (!) 52 10 17  Temp:    99.8 F (37.7 C)  TempSrc:    Oral  SpO2: 100% 98% 100% 98%  Weight:      Height:        Intake/Output Summary (Last 24 hours) at 04/02/2018 0916 Last data filed at 04/02/2018 0700 Gross per 24 hour  Intake 938.45 ml  Output 2100 ml  Net -1161.55 ml   Filed Weights   03/29/18 1820 03/29/18 1856  Weight: 65.4 kg (144 lb 2.9 oz) 121.4 kg (267 lb 10.2 oz)   General: Alert, Awake and Oriented to Time, Place and Person. Appear in moderate distress, affect appropriate Eyes: PERRL, Conjunctiva normal ENT: Oral Mucosa clear dry. Neck: difficult to assess JVD, no Abnormal Mass Or lumps Cardiovascular: S1 and S2 Present, aortic systolic  Murmur, Peripheral Pulses Present Respiratory: normal respiratory effort, Bilateral Air entry equal and Decreased, no use of accessory muscle, bilateral basal Crackles, no wheezes Abdomen: Bowel Sound present, Soft and no tenderness, no hernia Skin: no redness, no Rash, no induration, chronic leg ulcers, wrapped Extremities: bilateral  Pedal edema, positive calf tenderness Neurologic: Grossly no focal neuro deficit. Bilaterally Equal motor strength  Data Reviewed: CBC: Recent Labs  Lab 03/29/18 1110 03/30/18 0326 03/30/18 1747 03/31/18 0333 04/01/18 0346 04/02/18 0357  WBC 4.3 4.4 12.0* 10.5 11.9* 11.4*  NEUTROABS 3.0  --  10.1*  --    --   --   HGB 9.6* 8.7* 9.7* 8.9* 9.1* 9.9*  HCT 31.5* 28.8* 31.7* 29.4* 30.2* 32.6*  MCV 98.7 98.3 98.1 99.0 98.4 96.7  PLT 236 220 256 215 247 271   Basic Metabolic Panel: Recent Labs  Lab 03/30/18 0326 03/30/18 1747 03/31/18 0333 04/01/18 0346 04/01/18 2118 04/02/18 0357  NA 140 141 143 139  --  140  K 3.5 3.5 3.1* 2.9* 2.7* 3.1*  CL 95* 92* 95* 93*  --  90*  CO2 36* 34* 36* 34*  --  36*  GLUCOSE 110* 127* 124* 112*  --  101*  BUN 15 14 15 20   --  22  CREATININE 0.53 0.64 0.63 0.61  --  0.67  CALCIUM 9.3 9.5 9.2 8.9  --  8.9  MG 1.9  --  1.9  --   --   --     Liver Function Tests: Recent Labs  Lab 03/29/18 1110 03/30/18 0326 03/30/18 1747  AST 30 27 31   ALT 13 11 13   ALKPHOS 81 71 78  BILITOT 2.2* 1.8* 2.2*  PROT 7.5 6.5 7.3  ALBUMIN 2.5* 2.3* 2.5*   No results for input(s): LIPASE, AMYLASE in the last 168 hours. No results for input(s): AMMONIA in the last 168 hours. Coagulation Profile: Recent Labs  Lab 03/31/18 0333  INR 3.90   Cardiac Enzymes: No results for input(s): CKTOTAL, CKMB, CKMBINDEX, TROPONINI in the last 168 hours. BNP (last 3 results) No results for input(s): PROBNP in the last 8760 hours. CBG: Recent Labs  Lab 03/30/18 1639  GLUCAP 119*   Studies: No results found.  Scheduled Meds: . chlorhexidine  15 mL Mouth Rinse BID  . cholecalciferol  1,000 Units Oral Daily  . feeding supplement (ENSURE ENLIVE)  237 mL Oral Q24H  . feeding supplement (PRO-STAT SUGAR FREE 64)  30 mL Oral BID  . [START ON 04/09/2018] furosemide  80 mg Intravenous Daily  . furosemide  80 mg Intravenous Once  . hydrogen peroxide 1/2 strength irrigation   Irrigation Once  . magnesium oxide  400 mg Oral BID  . mouth rinse  15 mL Mouth Rinse q12n4p  . metoprolol tartrate  12.5 mg Oral BID  . midodrine  10 mg Oral TID WC  . multivitamin with minerals  1 tablet Oral Daily  . potassium chloride  40 mEq Oral BID  . potassium chloride  40 mEq Oral Once  .  rivaroxaban  15 mg Oral BID WC   Followed by  . [START ON 04/23/2018] rivaroxaban  20 mg Oral Q supper   Continuous Infusions: . sodium chloride Stopped (03/31/18 1226)  . albumin human    . piperacillin-tazobactam (ZOSYN)  IV Stopped (04/02/18 0626)  . vancomycin Stopped (04/01/18 2336)   PRN Meds: sodium chloride, acetaminophen **OR** acetaminophen, oxyCODONE, polyethylene glycol, simethicone  Time spent: The patient is critically ill with multiple  organ systems failure and requires high complexity decision making for assessment and support, frequent evaluation and titration of therapies. Critical Care Time devoted to patient care services described in this note is 35 minutes   Author: Lynden OxfordPranav Da Michelle, MD [facility] Pager: [phone] 04/02/2018 9:16 AM  If 7PM-7AM, please contact night-coverage at [email or web address], password [email or web address]

## 2018-04-02 NOTE — Progress Notes (Signed)
Progress Note  Patient Name: Hannah Elliott Date of Encounter: 04/02/2018  [facility] new to Dr. Duke Salviaandolph  Subjective   No cardiac complaints   Inpatient Medications    Scheduled Meds: . chlorhexidine  15 mL Mouth Rinse BID  . cholecalciferol  1,000 Units Oral Daily  . feeding supplement (ENSURE ENLIVE)  237 mL Oral Q24H  . feeding supplement (PRO-STAT SUGAR FREE 64)  30 mL Oral BID  . [START ON 03/31/2018] furosemide  80 mg Intravenous Daily  . furosemide  80 mg Intravenous Once  . hydrogen peroxide 1/2 strength irrigation   Irrigation Once  . magnesium oxide  400 mg Oral BID  . mouth rinse  15 mL Mouth Rinse q12n4p  . metoprolol tartrate  12.5 mg Oral BID  . midodrine  10 mg Oral TID WC  . multivitamin with minerals  1 tablet Oral Daily  . potassium chloride  40 mEq Oral BID  . potassium chloride  40 mEq Oral Once  . rivaroxaban  15 mg Oral BID WC   Followed by  . [START ON 04/23/2018] rivaroxaban  20 mg Oral Q supper   Continuous Infusions: . sodium chloride Stopped (03/31/18 1226)  . albumin human    . piperacillin-tazobactam (ZOSYN)  IV Stopped (04/02/18 0626)  . vancomycin Stopped (04/01/18 2336)   PRN Meds: sodium chloride, acetaminophen **OR** acetaminophen, oxyCODONE, polyethylene glycol, simethicone   Vital Signs    Vitals:   04/02/18 0400 04/02/18 0500 04/02/18 0700 04/02/18 0800  BP: (!) 129/52 114/62 (!) 117/59 104/68  Pulse: (!) 107 (!) 105 (!) 104 (!) 117  Resp: (!) 25 (!) 52 10 17  Temp:    99.8 F (37.7 C)  TempSrc:    Oral  SpO2: 100% 98% 100% 98%  Weight:      Height:        Intake/Output Summary (Last 24 hours) at 04/02/2018 0900 Last data filed at 04/02/2018 0700 Gross per 24 hour  Intake 938.45 ml  Output 2100 ml  Net -1161.55 ml   Filed Weights   03/29/18 1820 03/29/18 1856  Weight: 144 lb 2.9 oz (65.4 kg) 267 lb 10.2 oz (121.4 kg)    Physical Exam   Affect appropriate Chronically ill obese  white female  HEENT: normal Neck supple with no adenopathy JVP normal no bruits no thyromegaly Lungs clear with no wheezing and good diaphragmatic motion Heart:  S1/S2 AS  murmur, no rub, gallop or click PMI normal Abdomen: benighn, BS positve, no tenderness, no AAA no bruit.  No HSM or HJR Full thickness LE wounds with dressings in place Plus 2 LE edema  Neuro non-focal Skin warm and dry No muscular weakness   Labs    Chemistry Recent Labs  Lab 03/29/18 1110 03/30/18 0326 03/30/18 1747 03/31/18 0333 04/01/18 0346 04/01/18 2118 04/02/18 0357  NA 142 140 141 143 139  --  140  K 4.0 3.5 3.5 3.1* 2.9* 2.7* 3.1*  CL 100 95* 92* 95* 93*  --  90*  CO2 32 36* 34* 36* 34*  --  36*  GLUCOSE 139* 110* 127* 124* 112*  --  101*  BUN 18 15 14 15 20   --  22  CREATININE 0.56 0.53 0.64 0.63 0.61  --  0.67  CALCIUM 9.7 9.3 9.5 9.2 8.9  --  8.9  PROT 7.5 6.5 7.3  --   --   --   --   ALBUMIN 2.5* 2.3* 2.5*  --   --   --   --  AST 30 27 31   --   --   --   --   ALT 13 11 13   --   --   --   --   ALKPHOS 81 71 78  --   --   --   --   BILITOT 2.2* 1.8* 2.2*  --   --   --   --   GFRNONAA >60 >60 >60 >60 >60  --  >60  GFRAA >60 >60 >60 >60 >60  --  >60  ANIONGAP 10 9 15 12 12   --  14     Hematology Recent Labs  Lab 03/31/18 0333 04/01/18 0346 04/02/18 0357  WBC 10.5 11.9* 11.4*  RBC 2.97* 3.07* 3.37*  HGB 8.9* 9.1* 9.9*  HCT 29.4* 30.2* 32.6*  MCV 99.0 98.4 96.7  MCH 30.0 29.6 29.4  MCHC 30.3 30.1 30.4  RDW 20.6* 20.3* 19.8*  PLT 215 247 271    Cardiac EnzymesNo results for input(s): TROPONINI in the last 168 hours.  Recent Labs  Lab 03/29/18 1114 03/29/18 1501  TROPIPOC 0.15* 0.14*     BNP Recent Labs  Lab 03/29/18 1110  BNP 417.1*     DDimer No results for input(s): DDIMER in the last 168 hours.   Radiology    No results found.  Telemetry    03/31/18 Atrial fibrillation - Personally Reviewed  ECG    No new tracing as of 03/31/18 - Personally  Reviewed  Cardiac Studies   Echocardiogram 03/30/18: Study Conclusions  - Left ventricle: Wall thickness was increased in a pattern of mild   LVH. Systolic function was mildly reduced. The estimated ejection   fraction was in the range of 45% to 50%. The study is not   technically sufficient to allow evaluation of LV diastolic   function. - Aortic valve: Given degree of LV dysfunction AS may be severe   despite lower gradients DVI .36. There was trivial regurgitation.   Valve area (VTI): 1.49 cm^2. Valve area (Vmax): 1.6 cm^2. Valve   area (Vmean): 1.64 cm^2. - Mitral valve: Moderately calcified annulus. Mildly thickened   leaflets . Valve area by continuity equation (using LVOT flow):   2.69 cm^2. - Left atrium: The atrium was mildly dilated. - Atrial septum: No defect or patent foramen ovale was identified. - Tricuspid valve: There was moderate regurgitation. - Pulmonary arteries: PA peak pressure: 71 mm Hg (S).  Patient Profile     72 y.o. female with a hx of atrial fibrillation, s/p ablation of aflutter on chronic eliquis, CAD s/p CABG 2008 (LIMA-LAD, SVG to OM), ischemic cardiomyopathy with EF of45-50%, most recent cath with patent grafts (2010), lymphedema and lower extremity ulcers due to venous stasis who is being seen today for the evaluation ofatrial fibrillation with RVR and acute on chronic systolic and diastolic heart failureat the request of Dr. Jacqulyn Bath.  Assessment & Plan    1.  Atrial fibrillation with RVR: -Remains in AF with rates in the low 90-100's  continue Lopresor Xarelto started yesterday   2. Ischemic cardiomyopathy with acute systolic heart failure exacerbation: Stable still with inspiratory crackles on exam diuretic held due to low BP good diuresis yesterday 1100 cc follow  Daily to see if lasix needed   3.  CAD s/p CABG with elevated troponin: -Troponin, 0.15 -No anginal symptoms with no plans for ischemic work-up at this time  4.  Pulmonary  embolism: -Chest CT with possible subsegmental PE likely in the setting of leg fracture  and surgery while anticoagulation held and only taking Eliquis once per day  -On supplemental O2 with saturations stable   -Start Xarelto per primary service   5. Acute DVT: -Per LE [place] 03/30/18, evidence of acute DVT in the right and left posterior tibial veins  -Continue with high dose Lovenox SQ>>>then will transition to PO anticoagulation.  6. Hypokalemia: -K+ today  3.1  -Replaced per primary team    Charlton Haws

## 2018-04-02 NOTE — Progress Notes (Signed)
ANTICOAGULATION CONSULT NOTE  Pharmacy Consult for Lovenox - transition to Xarelto on 8/4 Indication: pulmonary embolus, DVT  Allergies  Allergen Reactions  . Altace [Ramipril]     Listed on MAR  . Biaxin [Clarithromycin]     Listed on MAR  . Ciprofloxacin     Listed on MAR  . Latex     Listed on MAR  . Reopro [Abciximab]     Listed on MAR  . Simvastatin     Listed on MAR  . Tape     [facility]adhesives) [facility]   Patient Measurements: Height: 5\' 6"  (167.6 cm) Weight: 267 lb 10.2 oz (121.4 kg) IBW/kg (Calculated) : 59.3  Vital Signs: Temp: 99.8 F (37.7 C) (08/04 0800) Temp Source: Oral (08/04 0800) BP: 104/68 (08/04 0800) Pulse Rate: 117 (08/04 0800)  Labs: Recent Labs    03/31/18 0333 04/01/18 0346 04/02/18 0357  HGB 8.9* 9.1* 9.9*  HCT 29.4* 30.2* 32.6*  PLT 215 247 271  LABPROT 37.9*  --   --   INR 3.90  --   --   CREATININE 0.63 0.61 0.67   Estimated Creatinine Clearance: 81.9 mL/min (by C-G formula based on SCr of 0.67 mg/dL).  Medical History: Past Medical History:  Diagnosis Date  . Aortic stenosis   . Arthritis   . Atrial fibrillation (HCC)   . CHF (congestive heart failure) (HCC)   . Coronary artery disease   . Hypertension   . Stroke [facility](HCC)    Medications:  Scheduled:  . chlorhexidine  15 mL Mouth Rinse BID  . cholecalciferol  1,000 Units Oral Daily  . feeding supplement (ENSURE ENLIVE)  237 mL Oral Q24H  . feeding supplement (PRO-STAT SUGAR FREE 64)  30 mL Oral BID  . [START ON 04/15/2018] furosemide  80 mg Intravenous Daily  . hydrogen peroxide 1/2 strength irrigation   Irrigation Once  . magnesium oxide  400 mg Oral BID  . mouth rinse  15 mL Mouth Rinse q12n4p  . metoprolol tartrate  12.5 mg Oral BID  . midodrine  10 mg Oral TID WC  . multivitamin with minerals  1 tablet Oral Daily  . potassium chloride  40 mEq Oral BID  . potassium chloride  40 mEq Oral Once   Assessment: [age] yoF admitted 7/31. Chest CT with possible subsegmental  PE likely in the setting of leg fracture and surgery while anticoagulation held and only taking Eliquis once per day. 8/1 also found acute DVT.  Cards is increased Eliquis to 10mg  bid x7 days then back down to 5mg . Apixaban changed to Lovenox 120mg  SQ q12 on 8/1  Today, 04/02/18  To change Lovenox to Xarelto per cards recommendations today  CBC stable  No reported bleeding  Goal of Therapy:  Monitor platelets by anticoagulation protocol: Yes   Plan:  1) Discontinue Lovenox 2) Start Xarelto 15mg  BID x 21 days then Xarelto 20mg  once daily 3) Monitor renal function, CBC and signs/symptoms of bleeding 4) Will educate patient on Xarelto prior to discharge   Hessie KnowsJustin M Jong Rickman, PharmD, BCPS Pager [phone] 04/02/2018 8:49 AM

## 2018-04-03 ENCOUNTER — Inpatient Hospital Stay
Admission: RE | Admit: 2018-04-03 | Discharge: 2018-04-30 | Disposition: E | Payer: Medicare Other | Source: Other Acute Inpatient Hospital | Attending: Internal Medicine | Admitting: Internal Medicine

## 2018-04-03 ENCOUNTER — Other Ambulatory Visit (HOSPITAL_COMMUNITY): Payer: Medicare Other

## 2018-04-03 ENCOUNTER — Inpatient Hospital Stay: Admission: AD | Admit: 2018-04-03 | Payer: Self-pay | Source: Ambulatory Visit | Admitting: Internal Medicine

## 2018-04-03 DIAGNOSIS — K2289 Other specified disease of esophagus: Secondary | ICD-10-CM

## 2018-04-03 DIAGNOSIS — T85598A Other mechanical complication of other gastrointestinal prosthetic devices, implants and grafts, initial encounter: Secondary | ICD-10-CM

## 2018-04-03 DIAGNOSIS — R0603 Acute respiratory distress: Secondary | ICD-10-CM

## 2018-04-03 DIAGNOSIS — J189 Pneumonia, unspecified organism: Secondary | ICD-10-CM

## 2018-04-03 DIAGNOSIS — Z0189 Encounter for other specified special examinations: Secondary | ICD-10-CM

## 2018-04-03 DIAGNOSIS — R110 Nausea: Secondary | ICD-10-CM

## 2018-04-03 DIAGNOSIS — J90 Pleural effusion, not elsewhere classified: Secondary | ICD-10-CM

## 2018-04-03 DIAGNOSIS — K228 Other specified diseases of esophagus: Secondary | ICD-10-CM

## 2018-04-03 DIAGNOSIS — J9690 Respiratory failure, unspecified, unspecified whether with hypoxia or hypercapnia: Secondary | ICD-10-CM

## 2018-04-03 LAB — CBC
HCT: 29.7 % — ABNORMAL LOW (ref 36.0–46.0)
Hemoglobin: 9.3 g/dL — ABNORMAL LOW (ref 12.0–15.0)
MCH: 30 pg (ref 26.0–34.0)
MCHC: 31.3 g/dL (ref 30.0–36.0)
MCV: 95.8 fL (ref 78.0–100.0)
PLATELETS: 266 10*3/uL (ref 150–400)
RBC: 3.1 MIL/uL — AB (ref 3.87–5.11)
RDW: 19.9 % — AB (ref 11.5–15.5)
WBC: 11 10*3/uL — ABNORMAL HIGH (ref 4.0–10.5)

## 2018-04-03 LAB — BASIC METABOLIC PANEL
Anion gap: 13 (ref 5–15)
BUN: 18 mg/dL (ref 8–23)
CALCIUM: 8.9 mg/dL (ref 8.9–10.3)
CO2: 35 mmol/L — AB (ref 22–32)
CREATININE: 0.55 mg/dL (ref 0.44–1.00)
Chloride: 92 mmol/L — ABNORMAL LOW (ref 98–111)
GFR calc non Af Amer: >60 mL/min (ref >60)
Glucose, Bld: 95 mg/dL (ref 70–99)
Potassium: 3.5 mmol/L (ref 3.5–5.1)
Sodium: 140 mmol/L (ref 135–145)

## 2018-04-03 MED ORDER — FUROSEMIDE 10 MG/ML IJ SOLN
80.0000 mg | Freq: Every day | INTRAMUSCULAR | Status: DC
  Administered 2018-04-03: 80 mg via INTRAVENOUS
  Filled 2018-04-03: qty 8

## 2018-04-03 MED ORDER — SENNOSIDES-DOCUSATE SODIUM 8.6-50 MG PO TABS: 2.0000 | ORAL_TABLET | Freq: Two times a day (BID) | ORAL | 0 refills | Status: AC

## 2018-04-03 MED ORDER — AMOXICILLIN-POT CLAVULANATE 500-125 MG PO TABS: 1.0000 | ORAL_TABLET | Freq: Three times a day (TID) | ORAL | 0 refills | Status: AC

## 2018-04-03 MED ORDER — FUROSEMIDE 10 MG/ML IJ SOLN: 80.0000 mg | Freq: Every day | INTRAMUSCULAR | 0 refills | Status: AC

## 2018-04-03 MED ORDER — PRO-STAT SUGAR FREE PO LIQD: 30.0000 mL | Freq: Two times a day (BID) | ORAL | 0 refills | Status: AC

## 2018-04-03 MED ORDER — POTASSIUM CHLORIDE CRYS ER 20 MEQ PO TBCR
40.0000 meq | EXTENDED_RELEASE_TABLET | Freq: Every day | ORAL | Status: DC
  Administered 2018-04-03: 40 meq via ORAL
  Filled 2018-04-03: qty 2

## 2018-04-03 MED ORDER — POLYETHYLENE GLYCOL 3350 17 G PO PACK
17.0000 g | PACK | Freq: Every day | ORAL | Status: DC
  Administered 2018-04-03: 17 g via ORAL
  Filled 2018-04-03: qty 1

## 2018-04-03 MED ORDER — POTASSIUM CHLORIDE CRYS ER 20 MEQ PO TBCR: 40.0000 meq | EXTENDED_RELEASE_TABLET | Freq: Every day | ORAL | 0 refills | Status: AC

## 2018-04-03 MED ORDER — DOXYCYCLINE HYCLATE 100 MG PO TABS: 100.0000 mg | ORAL_TABLET | Freq: Two times a day (BID) | ORAL | 0 refills | Status: AC

## 2018-04-03 MED ORDER — METOPROLOL TARTRATE 25 MG PO TABS: 25.0000 mg | ORAL_TABLET | Freq: Two times a day (BID) | ORAL | 0 refills | Status: AC

## 2018-04-03 MED ORDER — CARVEDILOL 6.25 MG PO TABS: 6.2500 mg | ORAL_TABLET | Freq: Two times a day (BID) | ORAL | 0 refills | Status: DC

## 2018-04-03 MED ORDER — DOXYCYCLINE HYCLATE 100 MG PO TABS
100.0000 mg | ORAL_TABLET | Freq: Two times a day (BID) | ORAL | Status: DC
  Administered 2018-04-03: 100 mg via ORAL
  Filled 2018-04-03: qty 1

## 2018-04-03 MED ORDER — POLYETHYLENE GLYCOL 3350 17 G PO PACK: 17.0000 g | PACK | Freq: Every day | ORAL | 0 refills | Status: AC

## 2018-04-03 MED ORDER — RIVAROXABAN (XARELTO) VTE STARTER PACK (15 & 20 MG): ORAL_TABLET | ORAL | 0 refills | Status: AC

## 2018-04-03 MED ORDER — OXYCODONE HCL 5 MG PO TABS
5.0000 mg | ORAL_TABLET | ORAL | Status: DC | PRN
  Administered 2018-04-03 (×2): 5 mg via ORAL
  Filled 2018-04-03 (×2): qty 1

## 2018-04-03 MED ORDER — SENNOSIDES-DOCUSATE SODIUM 8.6-50 MG PO TABS
2.0000 | ORAL_TABLET | Freq: Two times a day (BID) | ORAL | Status: DC
  Administered 2018-04-03: 2 via ORAL
  Filled 2018-04-03: qty 2

## 2018-04-03 MED ORDER — ADULT MULTIVITAMIN W/MINERALS CH: 1.0000 | ORAL_TABLET | Freq: Every day | ORAL | 0 refills | Status: AC

## 2018-04-03 NOTE — Discharge Instructions (Signed)
Information on my medicine - XARELTO (rivaroxaban)  This medication education was reviewed with me or my healthcare representative as part of my discharge preparation. WHY WAS XARELTO PRESCRIBED FOR YOU? Xarelto was prescribed to treat blood clots that may have been found in the veins of your legs (deep vein thrombosis) or in your lungs (pulmonary embolism) and to reduce the risk of them occurring again.  What do you need to know about Xarelto? The starting dose is one 15 mg tablet taken TWICE daily with food for the FIRST 21 DAYS then after all the 15 mg tablets have been taken  the dose is changed to one 20 mg tablet taken ONCE A DAY with your evening meal.  DO NOT stop taking Xarelto without talking to the health care provider who prescribed the medication.  Refill your prescription for 20 mg tablets before you run out.  After discharge, you should have regular check-up appointments with your healthcare provider that is prescribing your Xarelto.  In the future your dose may need to be changed if your kidney function changes by a significant amount.  What do you do if you miss a dose? If you are taking Xarelto TWICE DAILY and you miss a dose, take it as soon as you remember. You may take two 15 mg tablets (total 30 mg) at the same time then resume your regularly scheduled 15 mg twice daily the next day.  If you are taking Xarelto ONCE DAILY and you miss a dose, take it as soon as you remember on the same day then continue your regularly scheduled once daily regimen the next day. Do not take two doses of Xarelto at the same time.   Important Safety Information Xarelto is a blood thinner medicine that can cause bleeding. You should call your healthcare provider right away if you experience any of the following: ? Bleeding from an injury or your nose that does not stop. ? Unusual colored urine (red or dark brown) or unusual colored stools (red or black). ? Unusual bruising for unknown  reasons. ? A serious fall or if you hit your head (even if there is no bleeding).  Some medicines may interact with Xarelto and might increase your risk of bleeding while on Xarelto. To help avoid this, consult your healthcare provider or pharmacist prior to using any new prescription or non-prescription medications, including herbals, vitamins, non-steroidal anti-inflammatory drugs (NSAIDs) and supplements.  This website has more information on Xarelto: www.xarelto.com. 

## 2018-04-03 NOTE — Discharge Summary (Signed)
[facility] Discharge Summary   Patient: Hannah Elliott WUJ:[id number]   PCP: Hannah Elliott DOB: [birth year]   Date of admission: 03/29/2018   Date of discharge:  2018/04/17    Discharge Diagnoses:  Principal Problem:   Heart failure (HCC) Active Problems:   Acute on chronic combined systolic and diastolic CHF (congestive heart failure) (HCC)   Persistent atrial fibrillation (HCC)   Essential hypertension   Acute deep vein thrombosis (DVT) of distal vein of right lower extremity (HCC)   Acute respiratory distress   Sepsis (HCC)   Cardiogenic shock (HCC)   Aortic stenosis  [facility]  [facility]  Recommendations for Outpatient Follow-up:  1. please follow-up with orthopedic in 1 week.  Also follow-up with PCP on discharge.  Follow-up with cardiology as recommended.  Follow-up Information    PCP. Schedule an appointment as soon as possible for a visit in 1 week(s).        Forest Becker, MD. Schedule an appointment as soon as possible for a visit in 1 week(s).   Specialty:  General Practice Contact information: [address] [place] [address] [phone]          Diet recommendation: Cardiac diet  Activity: The patient is advised to gradually reintroduce usual activities.  Discharge Condition: stable  Code Status: Full code  History of present illness: As Elliott the H and P dictated on admission, " Hannah Elliott is a 72 y.o. female with complex medical history significant for recent hospitalization for pseudomonas bacteremia at [facility] in addition to R femoral and ankle fx s/p ORIF, CAD s/p CABG, atrial fibrillation on eliquis, heart failure, chronic lower extremity wounds, and several other medical problems presenting with worsening shortness of breath due to suspected heart failure exacerbation as well as atrial fibrillation with RVR and possible PE.  Hannah Elliott was recently admitted to [facility] requiring ICU stay for  pseudomonas bacteremia, afib with RVR and fall resulting in fx of R femur, distal tib/fib and 4th metatarsal.  She was ultimately discharged to [facility] after improvement.  She represents today with worsening shortness of breath.  She was being transferred to [facility] for a follow up test for her leg (family not able to say exactly what test), but in transit, pt became SOB and PTAR called EMS who transported the patient to the emergency department.  She received albuterol en route.  She denies CP, fevers, chills, cough, abdominal pain.  She denied SOB at this time to me, but she's on 2 L [place] (she reports she's been on O2 since discharge to [facility]).  She notes RLE leg pain."  Hospital Course:  Summary of her active problems in the hospital is as following. 1.Acute hypoxic respiratory failure. Acute on chronic diastolic and systolic CHF. A. fib with RVR. Hypotension. Cardiology consulted. Patient started on IV Lasix every 8 hours, Elliott cardiology note Lasix on hold due to blood pressure.  Patient is still volume overloaded, will give IV albumin followed by IV Lasix. Patient is on aggressive IV diuresis until 04/02/2018 and still not diuresing briskly likely from hypoalbuminemia as well as hypoperfusion. Subjectively shortness of breath is currently improving. Also started on IV amiodarone infusion.  Completed and now on Lopressor for rate control. Rate appears to be uncontrolled.  Recommend to continue current Lopressor dose.  Currently I will increase to 25 mg twice daily and prefer transition to Toprol-XL once the patient is more stable. Patient on Eliquis for anticoagulation  prior to admission but only taking once a day. Continue IV Lasix 80 mg daily will use IV albumin today.  2.Acute bilateral lower extremity DVT and pulmonary embolism. Scan of the chest on admission is positive for right upper lobe PE. Although the study was inadequate. However, her lower extremity is positive for bilateral  DVT which are acute. Patient is on Eliquis although she reports that at home she was taking it only once a day. Patient has been at [facility] between 03/04/2018 and 03/11/2018 and from there has been discharged to [facility] where she has been to this admission. I am presuming that the patient was getting her Eliquis twice a day during this whole length of stay. With this I suspect that patient should be switched to another anticoagulant. ideally would switch her to Xarelto which would also improve patient's compliance since she was taking Eliquis only once a day as it was too much for her to take. Pharmacy consulted for transition to Xarelto. Currently no indication for thrombolysis. Discussed with case management and they informed me that cost of Xarelto versus Eliquis would be same.  3.  Sepsis. With fever tachycardia and hypotension sepsis cannot be ruled out. Although I suspect that her fever is coming from bilateral acute DVTs. CT chest was negative for any acute infiltrate Blood cultures were positive therefore Pseudomonas which was pansensitive, blood cultures here are negative. empirically patient was started on IV vancomycin and Zosyn. MRSA PCR is negative. We will transition her to oral Augmentin and doxycycline, total 7 day Antibiotics from today. 04/11/2018   4. Acute encephalopathy likely toxic due to medication. Patient is currently drowsy and lethargic and nonresponsive but withdraws to painful stimuli. CT head unremarkable ABG unremarkable. Patient received scheduled OxyContin which I think is the cause for encephalopathy. Discontinue scheduled narcotics and continue only PRN medications. Currently resolved. Monitor.  5. Right femoral ankle fracture. S/P ORIF 03/06/2018 at [facility]. Cherlynn Polo still in place admission. Orthopedic consulted, staples removed nonweightbearing to right lower extremity. Patient will follow up with [facility] on discharge. Ankle pin  still in place.  6.Chronic bilateral lower extremity wounds. Full-thickness ulceration of the left posterior calf, right lower exam to surgical wound. Wound care consulted, silicone foam to the posterior left calf wound and medial malleolar wound change every 3 days and as needed. Posterior calf wound painted with Betadine daily allowed to air dry cover with silicone foam.  7.CAD S/P CABG. Elevated troponin. Cardiology consulted. Likely demand ischemia. On aspirin 81 mg.   8. chronic anemia. H&H stable. Monitor.  9.  Hypokalemia. Replacing.  10.  Disposition. Patient is actually from [facility].  PT OT recommended LTAC, [facility].   All other chronic medical condition were stable during the hospitalization.  Patient was seen by physical therapy, who recommended LTAC which was arranged by Child psychotherapist and case Production designer, theatre/television/film. On the day of the discharge the patient's vitals were stable , and no other acute medical condition were reported by patient. the patient was felt safe to be discharge at [facility] with [facility].  Consultants: cardiology  Procedures: Echocardiogram   DISCHARGE MEDICATION: Allergies as of 04/10/2018      Reactions   Altace [ramipril]    Listed on MAR   Biaxin [clarithromycin]    Listed on MAR   Ciprofloxacin    Listed on MAR   Latex    Listed on MAR   Reopro [abciximab]    Listed on [facility]  Simvastatin    Listed on MAR   Tape    (adhesives) Listed on MAR      Medication List    STOP taking these medications   apixaban 5 MG Tabs tablet Commonly known as:  ELIQUIS   gabapentin 300 MG capsule Commonly known as:  NEURONTIN   torsemide 5 MG tablet Commonly known as:  DEMADEX     TAKE these medications   acetaminophen 325 MG tablet Commonly known as:  TYLENOL Take 1,300 mg by mouth every 8 (eight) hours as needed for mild pain, fever or headache.   amiodarone 200 MG tablet Commonly known as:  PACERONE Take  200 mg by mouth daily.   amoxicillin-clavulanate 500-125 MG tablet Commonly known as:  AUGMENTIN Take 1 tablet (500 mg total) by mouth 3 (three) times daily for 7 days.   aspirin EC 81 MG tablet Take 81 mg by mouth daily.   cholecalciferol 1000 units tablet Commonly known as:  VITAMIN D Take 1,000 Units by mouth daily.   doxycycline 100 MG tablet Commonly known as:  VIBRA-TABS Take 1 tablet (100 mg total) by mouth every 12 (twelve) hours for 7 days.   ENSURE PLUS Liqd Take 237 mLs by mouth 3 (three) times daily with meals.   feeding supplement (PRO-STAT SUGAR FREE 64) Liqd Take 30 mLs by mouth 2 (two) times daily.   furosemide 10 MG/ML injection Commonly known as:  LASIX Inject 8 mLs (80 mg total) into the vein daily. Start taking on:  04/04/2018   magnesium oxide 400 MG tablet Commonly known as:  MAG-OX Take 400 mg by mouth 2 (two) times daily.   metoprolol tartrate 25 MG tablet Commonly known as:  LOPRESSOR Take 1 tablet (25 mg total) by mouth 2 (two) times daily.   midodrine 10 MG tablet Commonly known as:  PROAMATINE Take 10 mg by mouth 3 (three) times daily after meals.   multivitamin with minerals Tabs tablet Take 1 tablet by mouth daily. Start taking on:  04/04/2018   oxyCODONE 5 MG immediate release tablet Commonly known as:  Oxy IR/ROXICODONE Take 5 mg by mouth every 4 (four) hours as needed for severe pain. What changed:  Another medication with the same name was removed. Continue taking this medication, and follow the directions you see here.   polyethylene glycol packet Commonly known as:  MIRALAX / GLYCOLAX Take 17 g by mouth daily. Start taking on:  04/04/2018   potassium chloride SA 20 MEQ tablet Commonly known as:  K-DUR,KLOR-CON Take 2 tablets (40 mEq total) by mouth daily. Start taking on:  04/04/2018   Rivaroxaban 15 & 20 MG Tbpk Take as directed on package: Start with one 15mg  tablet by mouth twice a day with food. On Day 22, switch to one 20mg   tablet once a day with food.   senna-docusate 8.6-50 MG tablet Commonly known as:  Senokot-S Take 2 tablets by mouth 2 (two) times daily.   simethicone 80 MG chewable tablet Commonly known as:  MYLICON Chew 80 mg by mouth every 6 (six) hours as needed for flatulence.   vitamin C 500 MG tablet Commonly known as:  ASCORBIC ACID Take 500 mg by mouth daily.            Discharge Care Instructions  (From admission, onward)        Start     Ordered   04/14/2018 0000  Non weight bearing    Question Answer Comment  Laterality right   Extremity  Lower      04/12/2018 1200     Allergies  Allergen Reactions  . Altace [Ramipril]     Listed on MAR  . Biaxin [Clarithromycin]     Listed on MAR  . Ciprofloxacin     Listed on MAR  . Latex     Listed on MAR  . Reopro [Abciximab]     Listed on MAR  . Simvastatin     Listed on MAR  . Tape     (adhesives) Listed on [facility]   Discharge Instructions    Diet - low sodium heart healthy   Complete by:  As directed    Discharge instructions   Complete by:  As directed    It is important that you read following instructions as well as go over your medication list with RN to help you understand your care after this hospitalization.  Discharge Instructions: Please follow-up with PCP in one week  Please request your primary care physician to go over all Hospital Tests and Procedure/Radiological results at the follow up,  Please get all Hospital records sent to your PCP by signing hospital release before you go home.   Do not drive, operating heavy machinery, perform activities at heights, swimming or participation in water activities or provide baby sitting services; until you have been seen by Primary Care Physician or a Neurologist and advised to do so again. Do not take more than prescribed Pain, Sleep and Anxiety Medications. You were cared for by a hospitalist during your hospital stay. If you have any questions about your discharge  medications or the care you received while you were in the hospital after you are discharged, you can call the unit and ask to speak with the hospitalist on call if the hospitalist that took care of you is not available.  Once you are discharged, your primary care physician will handle any further medical issues. Please note that NO REFILLS for any discharge medications will be authorized once you are discharged, as it is imperative that you return to your primary care physician (or establish a relationship with a primary care physician if you do not have one) for your aftercare needs so that they can reassess your need for medications and monitor your lab values. You Must read complete instructions/literature along with all the possible adverse reactions/side effects for all the Medicines you take and that have been prescribed to you. Take any new Medicines after you have completely understood and accept all the possible adverse reactions/side effects. Wear Seat belts while driving. If you have smoked or chewed Tobacco in the last 2 yrs please stop smoking and/or stop any Recreational drug use.   Increase activity slowly   Complete by:  As directed    Non weight bearing   Complete by:  As directed    Laterality:  right   Extremity:  Lower     Discharge Exam: Filed Weights   03/29/18 1820 03/29/18 1856  Weight: 65.4 kg (144 lb 2.9 oz) 121.4 kg (267 lb 10.2 oz)   Vitals:   04/22/2018 0900 04/20/2018 1000  BP: (!) 123/50 (!) 100/52  Pulse: 100 96  Resp: (!) 26 16  Temp:    SpO2: 95% 96%   General: Appear in moderate distress, right leg erythema no other Rash; Oral Mucosa moist. Cardiovascular: S1 and S2 Present, aortic systolic  Murmur, difficult to assess  JVD Respiratory: Bilateral Air entry present and basal Crackles, no wheezes Abdomen: Bowel Sound present, Soft and  no tenderness Extremities: bilateral  Pedal edema, bilateral  calf tenderness Neurology: Grossly no focal neuro  deficit.  The results of significant diagnostics from this hospitalization (including imaging, microbiology, ancillary and laboratory) are listed below for reference.    Significant Diagnostic Studies: Dg Chest 2 View  Result Date: 03/29/2018 CLINICAL DATA:  Shortness of breath. History of CHF, atrial fibrillation, coronary artery disease, and previous CVA. EXAM: CHEST - 2 VIEW COMPARISON:  None in PACs FINDINGS: The lungs are adequately inflated. The interstitial markings are increased. The cardiac silhouette is enlarged. The pulmonary vascularity is engorged. There are bilateral pleural effusions layering posteriorly. The patient has undergone previous CABG. The observed bony thorax is unremarkable. IMPRESSION: Findings likely reflect CHF. Bilateral pleural effusions layering posteriorly. One cannot exclude atelectasis or pneumonia in the left lower lobe. Previous CABG.  Thoracic aortic atherosclerosis. Electronically Signed   By: David  [place]az[place] M.D.   On: 03/29/2018 11:11   Dg Ankle Complete Right  Result Date: 03/29/2018 CLINICAL DATA:  Ankle fracture EXAM: RIGHT ANKLE - COMPLETE 3+ VIEW COMPARISON:  None. FINDINGS: No prior radiographs available for comparison. Screw fixation of the distal fibula. Fixating pins across medial malleolar fracture without significant displacement. Long presumed metallic fixating device extending from plantar surface of the foot to the distal shaft of the tibia. Suspect that there may be a posterior malleolar fracture. IMPRESSION: Surgically fixated fractures involving the distal fibula and tibia. Hardware appears intact. Alignment is near anatomic. Electronically Signed   By: Jasmine Pang M.D.   On: 03/29/2018 17:18   Ct Head Wo Contrast  Result Date: 03/30/2018 CLINICAL DATA:  72 y/o  F; altered mental status. EXAM: CT HEAD WITHOUT CONTRAST TECHNIQUE: Contiguous axial images were obtained from the base of the skull through the vertex without intravenous contrast.  COMPARISON:  None. FINDINGS: Brain: No evidence of acute infarction, hemorrhage, hydrocephalus, extra-axial collection or mass lesion/mass effect. Mild chronic microvascular ischemic changes and volume loss of the brain. Vascular: Calcific atherosclerosis of carotid siphons. No hyperdense vessel identified. Skull: Normal. Negative for fracture or focal lesion. Sinuses/Orbits: No acute finding. Other: None. IMPRESSION: 1. No acute intracranial abnormality identified. 2. Mild chronic microvascular ischemic changes and volume loss of the brain for age. Electronically Signed   By: Mitzi Hansen M.D.   On: 03/30/2018 17:35   Ct Angio Chest Pe W And/or Wo Contrast  Result Date: 03/29/2018 CLINICAL DATA:  Acute shortness of breath. EXAM: CT ANGIOGRAPHY CHEST WITH CONTRAST TECHNIQUE: Multidetector CT imaging of the chest was performed using the standard protocol during bolus administration of intravenous contrast. Multiplanar CT image reconstructions and MIPs were obtained to evaluate the vascular anatomy. CONTRAST:  ISOVUE-370 IOPAMIDOL (ISOVUE-370) INJECTION 76% COMPARISON:  Chest radiograph 03/29/2018 FINDINGS: Cardiovascular: Suboptimal evaluation of the segmental pulmonary arteries. No evidence of central hemodynamically significant pulmonary embolus. Possible nonocclusive small pulmonary embolus in a segmental branch of the right upper lobe. Enlarged heart. Marked calcific atherosclerotic disease of the coronary arteries. Post hemorrhage. Mitral and aortic valve annular calcifications. Mediastinum/Nodes: No enlarged mediastinal, hilar, or axillary lymph nodes. Thyroid gland, trachea, and esophagus demonstrate no significant findings. Lungs/Pleura: Mixed alveolar and interstitial opacities bilaterally with dependent predominance. Bilateral small to moderate pleural effusions. Bilateral lower lobe subsegmental atelectasis. Upper Abdomen: No acute abnormality. Musculoskeletal: Spondylosis of the  thoracic spine. Review of the MIP images confirms the above findings. IMPRESSION: Suboptimal evaluation of the segmental pulmonary arteries. No evidence of central hemodynamically significant pulmonary embolus. Possible nonocclusive small pulmonary embolus  in a segmental branch of the right upper lobe. Cardiomegaly, mixed pattern pulmonary edema with small to moderate bilateral pleural effusions and bibasilar subsegmental atelectasis. Aortic Atherosclerosis (ICD10-I70.0). Electronically Signed   By: Ted Mcalpineobrinka  Dimitrova M.D.   On: 03/29/2018 14:06   Dg Chest Port 1 View  Result Date: 03/31/2018 CLINICAL DATA:  Shortness of breath. EXAM: PORTABLE CHEST 1 VIEW COMPARISON:  Radiographs of March 29, 2018. FINDINGS: Stable cardiomegaly is noted with central pulmonary vascular congestion. No pneumothorax is noted. Interval development of moderate left pleural effusion is noted. Probable bilateral pulmonary edema is noted. Small right pleural effusion is noted. Probable bibasilar atelectasis and edema is noted. Bony thorax is unremarkable. Sternotomy wires are noted. IMPRESSION: Stable cardiomegaly. Probable bilateral pulmonary edema is noted with interval development of bilateral pleural effusions, left greater than right. Electronically Signed   By: Lupita RaiderJames  Green Jr, M.D.   On: 03/31/2018 09:04   Dg Foot Complete Right  Result Date: 03/29/2018 CLINICAL DATA:  Fractures EXAM: RIGHT FOOT COMPLETE - 3+ VIEW COMPARISON:  None. FINDINGS: No prior radiographs available for comparison. Osteopenia limits the exam. There appear to be mildly displaced fractures at the neck of the second proximal phalanx, head of the third proximal phalanx, and neck of the fourth proximal phalanx. Possible nondisplaced fracture neck of the fifth proximal phalanx. Dorsiflexion at the MCP joints. Possible dorsal subluxation of the base of either the fourth or fifth proximal phalanx with respect to the head of the metacarpal. IMPRESSION: 1.  Osteopenia limits the exam. Suspected mildly displaced fractures involving the distal aspects of the second through fifth proximal phalanges. Appearance of possible dorsal subluxation of either the fourth or fifth proximal phalanx with respect to the head of the metatarsal. Electronically Signed   By: Jasmine PangKim  Fujinaga M.D.   On: 03/29/2018 17:26   Dg Femur, Min 2 Views Right  Result Date: 03/29/2018 CLINICAL DATA:  Femur fracture EXAM: RIGHT FEMUR 2 VIEWS COMPARISON:  None. FINDINGS: No prior radiographs available for comparison. Cutaneous staples within the right thigh. Patient is status post surgical plate and multiple screw fixation of the proximal to distal femur. Status post knee replacement. Comminuted and slightly impacted distal femoral fracture adjacent to the knee prosthesis. Vascular calcifications. Diffuse soft tissue edema. IMPRESSION: 1. Status post knee replacement. Surgical plate and screw fixation of the right femur with impacted and slightly comminuted distal femoral fracture slightly cephalad to the knee replacement. No significant bridging callus at this time. Electronically Signed   By: Jasmine PangKim  Fujinaga M.D.   On: 03/29/2018 17:14    Microbiology: Recent Results (from the past 240 hour(s))  MRSA PCR Screening     Status: None   Collection Time: 03/29/18  6:42 PM  Result Value Ref Range Status   MRSA by PCR NEGATIVE NEGATIVE Final    Comment:        The GeneXpert MRSA Assay (FDA approved for NASAL specimens only), is one component of a comprehensive MRSA colonization surveillance program. It is not intended to diagnose MRSA infection nor to guide or monitor treatment for MRSA infections. Performed at [facility], [address] [place], [place] [address]   Culture, blood (x 2)     Status: None (Preliminary result)   Collection Time: 03/31/18  9:28 AM  Result Value Ref Range Status   Specimen Description   Final    BLOOD RIGHT HAND Performed at [facility], [address] [place], [place] [address]  Special Requests   Final    BOTTLES DRAWN AEROBIC AND ANAEROBIC Blood Culture adequate volume Performed at [facility], [address] [place], [place] [address]    Culture   Final    NO GROWTH 3 DAYS Performed at [facility], [address], [place], [place] [address]    Report Status PENDING  Incomplete  Culture, blood (x 2)     Status: None (Preliminary result)   Collection Time: 03/31/18  9:28 AM  Result Value Ref Range Status   Specimen Description   Final    BLOOD RIGHT ANTECUBITAL Performed at [facility], [address] [place], [place] [address]    Special Requests   Final    BOTTLES DRAWN AEROBIC AND ANAEROBIC Blood Culture adequate volume Performed at [facility], [address] [place], [place] [address]    Culture   Final    NO GROWTH 3 DAYS Performed at [facility], [address], [place], [place] [address]    Report Status PENDING  Incomplete     Labs: CBC: Recent Labs  Lab 03/29/18 1110  03/30/18 1747 03/31/18 0333 04/01/18 0346 04/02/18 0357 04/12/2018 0358  WBC 4.3   < > 12.0* 10.5 11.9* 11.4* 11.0*  NEUTROABS 3.0  --  10.1*  --   --   --   --   HGB 9.6*   < > 9.7* 8.9* 9.1* 9.9* 9.3*  HCT 31.5*   < > 31.7* 29.4* 30.2* 32.6* 29.7*  MCV 98.7   < > 98.1 99.0 98.4 96.7 95.8  PLT 236   < > 256 215 247 271 266   < > = values in this interval not displayed.   Basic Metabolic Panel: Recent Labs  Lab 03/30/18 0326 03/30/18 1747 03/31/18 0333 04/01/18 0346 04/01/18 2118 04/02/18 0357 04/04/2018 0358  NA 140 141 143 139  --  140 140  K 3.5 3.5 3.1* 2.9* 2.7* 3.1* 3.5  CL 95* 92* 95* 93*  --  90* 92*  CO2 36* 34* 36* 34*  --  36* 35*  GLUCOSE 110* 127* 124* 112*  --  101* 95  BUN 15 14 15 20   --  22 18  CREATININE 0.53 0.64 0.63 0.61  --  0.67 0.55  CALCIUM 9.3 9.5 9.2 8.9  --  8.9 8.9  MG 1.9  --  1.9   --   --   --   --    Liver Function Tests: Recent Labs  Lab 03/29/18 1110 03/30/18 0326 03/30/18 1747  AST 30 27 31   ALT 13 11 13   ALKPHOS 81 71 78  BILITOT 2.2* 1.8* 2.2*  PROT 7.5 6.5 7.3  ALBUMIN 2.5* 2.3* 2.5*   No results for input(s): LIPASE, AMYLASE in the last 168 hours. No results for input(s): AMMONIA in the last 168 hours. Cardiac Enzymes: No results for input(s): CKTOTAL, CKMB, CKMBINDEX, TROPONINI in the last 168 hours. BNP (last 3 results) Recent Labs    03/29/18 1110  BNP 417.1*   CBG: Recent Labs  Lab 03/30/18 1639  GLUCAP 119*   Time spent: 35 minutes  Signed:  Lynden Oxford  [facility]  04/22/2018  , 12:38 PM

## 2018-04-03 NOTE — Progress Notes (Addendum)
Progress Note  Patient Name: Hannah Elliott Date of Encounter: 04/10/2018  Primary Cardiologist: No primary care provider on file.   Subjective   Moaning a lot today.  Cannot get an accurate history   Inpatient Medications    Scheduled Meds: . amoxicillin-clavulanate  1 tablet Oral TID  . chlorhexidine  15 mL Mouth Rinse BID  . cholecalciferol  1,000 Units Oral Daily  . doxycycline  100 mg Oral Q12H  . feeding supplement (ENSURE ENLIVE)  237 mL Oral Q24H  . feeding supplement (PRO-STAT SUGAR FREE 64)  30 mL Oral BID  . furosemide  80 mg Intravenous Daily  . magnesium oxide  400 mg Oral BID  . mouth rinse  15 mL Mouth Rinse q12n4p  . metoprolol tartrate  12.5 mg Oral BID  . midodrine  10 mg Oral TID WC  . multivitamin with minerals  1 tablet Oral Daily  . polyethylene glycol  17 g Oral Daily  . potassium chloride  40 mEq Oral Daily  . rivaroxaban  15 mg Oral BID WC   Followed by  . [START ON 04/23/2018] rivaroxaban  20 mg Oral Q supper  . senna-docusate  2 tablet Oral BID   Continuous Infusions: . sodium chloride Stopped (03/31/18 1226)   PRN Meds: sodium chloride, acetaminophen **OR** acetaminophen, oxyCODONE, simethicone   Vital Signs    Vitals:   04/10/2018 0700 04/14/2018 0800 04/09/2018 0900 04/17/2018 1000  BP: 131/61 (!) 133/56 (!) 123/50 (!) 100/52  Pulse: (!) 107 (!) 106 100 96  Resp: (!) 25 19 (!) 26 16  Temp:  (!) 101 F (38.3 C)    TempSrc:  Axillary    SpO2: 99% 99% 95% 96%  Weight:      Height:        Intake/Output Summary (Last 24 hours) at 04/04/2018 1153 Last data filed at 04/29/2018 0900 Gross per 24 hour  Intake 409.72 ml  Output 1900 ml  Net -1490.28 ml   Filed Weights   03/29/18 1820 03/29/18 1856  Weight: 144 lb 2.9 oz (65.4 kg) 267 lb 10.2 oz (121.4 kg)    Telemetry    Atrial fibrillation- Personally Reviewed  ECG    No new EKG to review- Personally Reviewed  Physical Exam   GEN: No acute distress.   Neck: No JVD Cardiac:   Irregularly irregular, no rubs, or gallops. 2/6 SM at RUSB to LLSB Respiratory: Clear to auscultation bilaterally. GI: Soft, nontender, non-distended  MS: 2+ LE edema; No deformity. Neuro:  Nonfocal  Psych: Normal affect   Labs    Chemistry Recent Labs  Lab 03/29/18 1110 03/30/18 0326 03/30/18 1747  04/01/18 0346 04/01/18 2118 04/02/18 0357 04/23/2018 0358  NA 142 140 141   < > 139  --  140 140  K 4.0 3.5 3.5   < > 2.9* 2.7* 3.1* 3.5  CL 100 95* 92*   < > 93*  --  90* 92*  CO2 32 36* 34*   < > 34*  --  36* 35*  GLUCOSE 139* 110* 127*   < > 112*  --  101* 95  BUN 18 15 14    < > 20  --  22 18  CREATININE 0.56 0.53 0.64   < > 0.61  --  0.67 0.55  CALCIUM 9.7 9.3 9.5   < > 8.9  --  8.9 8.9  PROT 7.5 6.5 7.3  --   --   --   --   --  ALBUMIN 2.5* 2.3* 2.5*  --   --   --   --   --   AST 30 27 31   --   --   --   --   --   ALT 13 11 13   --   --   --   --   --   ALKPHOS 81 71 78  --   --   --   --   --   BILITOT 2.2* 1.8* 2.2*  --   --   --   --   --   GFRNONAA >60 >60 >60   < > >60  --  >60 >60  GFRAA >60 >60 >60   < > >60  --  >60 >60  ANIONGAP 10 9 15    < > 12  --  14 13   < > = values in this interval not displayed.     Hematology Recent Labs  Lab 04/01/18 0346 04/02/18 0357 04/20/2018 0358  WBC 11.9* 11.4* 11.0*  RBC 3.07* 3.37* 3.10*  HGB 9.1* 9.9* 9.3*  HCT 30.2* 32.6* 29.7*  MCV 98.4 96.7 95.8  MCH 29.6 29.4 30.0  MCHC 30.1 30.4 31.3  RDW 20.3* 19.8* 19.9*  PLT 247 271 266    Cardiac EnzymesNo results for input(s): TROPONINI in the last 168 hours.  Recent Labs  Lab 03/29/18 1114 03/29/18 1501  TROPIPOC 0.15* 0.14*     BNP Recent Labs  Lab 03/29/18 1110  BNP 417.1*     DDimer No results for input(s): DDIMER in the last 168 hours.   Radiology    No results found.  Cardiac Studies   Echocardiogram 03/30/18: Study Conclusions  - Left ventricle: Wall thickness was increased in a pattern of mild LVH. Systolic function was mildly reduced. The  estimated ejection fraction was in the range of 45% to 50%. The study is not technically sufficient to allow evaluation of LV diastolic function. - Aortic valve: Given degree of LV dysfunction AS may be severe despite lower gradients DVI .36. There was trivial regurgitation. Valve area (VTI): 1.49 cm^2. Valve area (Vmax): 1.6 cm^2. Valve area (Vmean): 1.64 cm^2. - Mitral valve: Moderately calcified annulus. Mildly thickened leaflets . Valve area by continuity equation (using LVOT flow): 2.69 cm^2. - Left atrium: The atrium was mildly dilated. - Atrial septum: No defect or patent foramen ovale was identified. - Tricuspid valve: There was moderate regurgitation. - Pulmonary arteries: PA peak pressure: 71 mm Hg (S).  Patient Profile     72 y.o. female with a hx of atrial fibrillation, s/p ablation of aflutter on chronic eliquis, CAD s/p CABG 2008 (LIMA-LAD, SVG to OM), ischemic cardiomyopathy with EF of45-50%, most recent cath with patent grafts (2010), lymphedema and lower extremity ulcers due to venous stasis who is being seen today for the evaluation ofatrial fibrillation with RVR and acute on chronic systolic and diastolic heart failureat the request of Dr. Jacqulyn Bath.  Assessment & Plan    1. Atrial fibrillation with RVR: -Remains in atrial fibrillation with heart rate anywhere from 90 to 111 bpm -Febrile up to 101 today which may be also driving heart rate -Continue Lopressor 12.5 mg twice daily - cannot increase due to soft BP.  HR should improve as fever resolves  2. Ischemic cardiomyopathy with acute systolic heart failure exacerbation: -She put out 2.3 L yesterday and is net -5.4 L. -Weight is inaccurate and has not been checked in several days -Creatinine stable at 0.55 -her lungs are  clear but still has significant LE edema likely 3rd spacing from hypoalbuminemia -Now getting IV Lasix along with IV albumin per [facility]  3. CAD s/pCABG with elevated  troponin: -Troponin,0.15 -likely related to demand ischemia in the setting of acute PE -No anginal symptoms with no plans for ischemic work-up at this time -ASA stopped due to DOAC  4. Pulmonary embolism: -Chest CT with possible subsegmental PE likely in the setting of leg fracture and surgery while anticoagulation heldand only taking Eliquis once per day  -On supplemental O2 with saturations stable -Continue Xarelto which was changed from Eliquis in hopes of better compliance  5. Acute DVT: -Per LE US 03/30/18, evidence of acute DVT in the right and left posterior tibial veins  -Continue Xarelto  6. Hypokalemia: -K+ today  improved from yesterday but still borderline low at 3.5 -Replace per primary team   [facility]   Medication Recommendations:  Lopressor 12.5mg  BID.  Diuretics per [facility] Other recommendations (labs, testing, etc):  none Follow up as an outpatient:  1-2 weeks in our office  For questions or updates, please contact [facility] Please consult [email or web address] for contact info under Cardiology/STEMI.      Signed, Armanda Magicraci Turner, MD  04/18/2018, 11:53 AM

## 2018-04-03 NOTE — Progress Notes (Signed)
Pt is going with her IV in place per [facility] RN request

## 2018-04-03 NOTE — Progress Notes (Signed)
Called [facility] to give report on pt to RN for 5E29. RN was on lunch and was given my number to call back when ready.  Did notify her that [facility] had been called already about patient.

## 2018-04-03 NOTE — Evaluation (Addendum)
Physical Therapy Evaluation Patient Details Name: Hannah Elliott MRN: [id number] DOB: October 05, 1945 Today's Date: 04/14/2018   History of Present Illness  72 yo female admitted with acute respiratory failure, acute CHF, acute bil LE DVTs, acute PE, A fib with RVR. Recent history R femur fx-s/p ORIF/R ankle fx-s/p ORIF, R 4th met fx-non-op mgmt 03/06/18. Hx of MI, CABG, A flutter, A fib, venous insufficiency.     Clinical Impression  Bed level eval only on today. Pt was very drowsy-barely able to keep eyes open during session. Able to perform UE and LE assessments on today. Pt performed a couple of UE and L LE exercises as well with active assistance and stimulation from therapist. Deferred any further mobility for safety reasons. No family present during session. Pt reported wearing a post-op boot on R lower leg initially after surgery. She stated this was removed at some point. No family present during session. Will plan to follow and progress activity as tolerated once pt is more alert and participatory. Recommend SNF vs LTACH.     Follow Up Recommendations SNF vs LTACH    Equipment Recommendations  (TBD at next venue)    Recommendations for Other Services       Precautions / Restrictions Precautions Precautions: Fall Restrictions Weight Bearing Restrictions: Yes RLE Weight Bearing: Non weight bearing      Mobility  Bed Mobility               General bed mobility comments: NT-pt very drowsy and bil LE movement very painful when therapist attempts to move legs  Transfers                    Ambulation/Gait                Stairs            Wheelchair Mobility    Modified Rankin (Stroke Patients Only)       Balance                                             Pertinent Vitals/Pain Pain Assessment: Faces Faces Pain Scale: Hurts whole lot Pain Location: bil LEs with movement Pain Descriptors / Indicators: Moaning;Grimacing Pain  Intervention(s): Limited activity within patient's tolerance    Home Living Family/patient expects to be discharged to:: Unsure                      Prior Function           Comments: per chart review, working with PT/OT-bed mobility, sitting balance, ther ex     Hand Dominance        Extremity/Trunk Assessment   Upper Extremity Assessment Upper Extremity Assessment: Defer to OT evaluation    Lower Extremity Assessment Lower Extremity Assessment: LLE deficits/detail;RLE deficits/detail RLE Deficits / Details: AA hip flex (SLR) 2-/5. AA Limited R knee flexion < ~10 degrees. Visible pin protuding through plantar aspect of foot LLE Deficits / Details: AA DF/PF 2/5 (very painful). AA Knee flexion ~20 degrees (very painful)       Communication   Communication: No difficulties  Cognition Arousal/Alertness: (very drowsy) Behavior During Therapy: WFL for tasks assessed/performed Overall Cognitive Status: No family/caregiver present to determine baseline cognitive functioning(A&Ox2.)  General Comments: pt had difficulty staying awake during session      General Comments      Exercises     Assessment/Plan    PT Assessment Patient needs continued PT services  PT Problem List Decreased range of motion;Decreased activity tolerance;Decreased mobility;Decreased strength;Decreased balance;Decreased skin integrity;Decreased knowledge of use of DME;Decreased cognition;Decreased knowledge of precautions       PT Treatment Interventions Functional mobility training;Therapeutic activities;Therapeutic exercise;Balance training;Patient/family education    PT Goals (Current goals can be found in the Care Plan section)       Frequency Min 2X/week   Barriers to discharge        Co-evaluation               AM-PAC PT "6 Clicks" Daily Activity  Outcome Measure Difficulty turning over in bed (including adjusting  bedclothes, sheets and blankets)?: Unable Difficulty moving from lying on back to sitting on the side of the bed? : Unable Difficulty sitting down on and standing up from a chair with arms (e.g., wheelchair, bedside commode, etc,.)?: Unable Help needed moving to and from a bed to chair (including a wheelchair)?: Total Help needed walking in hospital room?: Total Help needed climbing 3-5 steps with a railing? : Total 6 Click Score: 6    End of Session   Activity Tolerance: Patient limited by pain(limited by drowsiness) Patient left: in bed;with bed alarm set   PT Visit Diagnosis: Other abnormalities of gait and mobility (R26.89);Muscle weakness (generalized) (M62.81);Pain Pain - Right/Left: (bil) Pain - part of body: Leg;Ankle and joints of foot    Time:  1005- 1013     Charges:  Mod level eval             Weston Anna, MPT Pager: [phone]

## 2018-04-03 NOTE — Progress Notes (Signed)
Called Marisue IvanLiz RN at [facility] to notify of recent meds given, also that patient has glasses and dentures that are in her 1 bag that is going to the facility with her

## 2018-04-03 NOTE — Progress Notes (Signed)
OT Cancellation Note  Patient Details Name: Hannah Elliott MRN: [id number] DOB: 03-21-1946   Cancelled Treatment:    Reason Eval/Treat Not Completed: Other (comment)  Noted pt to DC to [facility] this day. [facility], OT [phone]  Einar CrowEDDING, Norman Piacentini D 04/02/2018, 2:30 PM

## 2018-04-04 LAB — MAGNESIUM: MAGNESIUM: 1.9 mg/dL (ref 1.7–2.4)

## 2018-04-04 LAB — CBC WITH DIFFERENTIAL/PLATELET
ABS IMMATURE GRANULOCYTES: 0.1 10*3/uL (ref 0.0–0.1)
BASOS ABS: 0.1 10*3/uL (ref 0.0–0.1)
BASOS PCT: 1 %
Eosinophils Absolute: 0.4 10*3/uL (ref 0.0–0.7)
Eosinophils Relative: 4 %
HCT: 29.8 % — ABNORMAL LOW (ref 36.0–46.0)
Hemoglobin: 9.1 g/dL — ABNORMAL LOW (ref 12.0–15.0)
IMMATURE GRANULOCYTES: 1 %
Lymphocytes Relative: 10 %
Lymphs Abs: 1.2 10*3/uL (ref 0.7–4.0)
MCH: 29.5 pg (ref 26.0–34.0)
MCHC: 30.5 g/dL (ref 30.0–36.0)
MCV: 96.8 fL (ref 78.0–100.0)
MONOS PCT: 12 %
Monocytes Absolute: 1.3 10*3/uL — ABNORMAL HIGH (ref 0.1–1.0)
NEUTROS ABS: 8.3 10*3/uL — AB (ref 1.7–7.7)
NEUTROS PCT: 72 %
Platelets: 212 10*3/uL (ref 150–400)
RBC: 3.08 MIL/uL — AB (ref 3.87–5.11)
RDW: 19.7 % — AB (ref 11.5–15.5)
WBC: 11.4 10*3/uL — AB (ref 4.0–10.5)

## 2018-04-04 LAB — PROTIME-INR
INR: 3.73
PROTHROMBIN TIME: 36.7 s — AB (ref 11.4–15.2)

## 2018-04-04 LAB — COMPREHENSIVE METABOLIC PANEL
ALBUMIN: 2.2 g/dL — AB (ref 3.5–5.0)
ALT: 14 U/L (ref 0–44)
ANION GAP: 12 (ref 5–15)
AST: 26 U/L (ref 15–41)
Alkaline Phosphatase: 61 U/L (ref 38–126)
BUN: 12 mg/dL (ref 8–23)
CO2: 36 mmol/L — AB (ref 22–32)
Calcium: 8.9 mg/dL (ref 8.9–10.3)
Chloride: 92 mmol/L — ABNORMAL LOW (ref 98–111)
Creatinine, Ser: 0.58 mg/dL (ref 0.44–1.00)
GFR calc non Af Amer: >60 mL/min (ref >60)
GLUCOSE: 100 mg/dL — AB (ref 70–99)
POTASSIUM: 3.5 mmol/L (ref 3.5–5.1)
SODIUM: 140 mmol/L (ref 135–145)
TOTAL PROTEIN: 6.6 g/dL (ref 6.5–8.1)
Total Bilirubin: 2 mg/dL — ABNORMAL HIGH (ref 0.3–1.2)

## 2018-04-04 LAB — T4, FREE: Free T4: 1.52 ng/dL (ref 0.82–1.77)

## 2018-04-04 LAB — HEMOGLOBIN A1C
HEMOGLOBIN A1C: 4.3 % — AB (ref 4.8–5.6)
MEAN PLASMA GLUCOSE: 76.71 mg/dL

## 2018-04-04 LAB — TSH: TSH: 4.287 u[IU]/mL (ref 0.350–4.500)

## 2018-04-04 LAB — PHOSPHORUS: Phosphorus: 2.3 mg/dL — ABNORMAL LOW (ref 2.5–4.6)

## 2018-04-05 LAB — CBC
HCT: 29.9 % — ABNORMAL LOW (ref 36.0–46.0)
Hemoglobin: 9.1 g/dL — ABNORMAL LOW (ref 12.0–15.0)
MCH: 29.5 pg (ref 26.0–34.0)
MCHC: 30.4 g/dL (ref 30.0–36.0)
MCV: 97.1 fL (ref 78.0–100.0)
PLATELETS: 213 10*3/uL (ref 150–400)
RBC: 3.08 MIL/uL — AB (ref 3.87–5.11)
RDW: 20 % — ABNORMAL HIGH (ref 11.5–15.5)
WBC: 10.3 10*3/uL (ref 4.0–10.5)

## 2018-04-05 LAB — CULTURE, BLOOD (ROUTINE X 2)
CULTURE: NO GROWTH
Culture: NO GROWTH
SPECIAL REQUESTS: ADEQUATE
Special Requests: ADEQUATE

## 2018-04-05 LAB — BASIC METABOLIC PANEL
Anion gap: 11 (ref 5–15)
BUN: 12 mg/dL (ref 8–23)
CHLORIDE: 88 mmol/L — AB (ref 98–111)
CO2: 38 mmol/L — ABNORMAL HIGH (ref 22–32)
CREATININE: 0.62 mg/dL (ref 0.44–1.00)
Calcium: 8.7 mg/dL — ABNORMAL LOW (ref 8.9–10.3)
GFR calc Af Amer: >60 mL/min (ref >60)
Glucose, Bld: 98 mg/dL (ref 70–99)
POTASSIUM: 3.1 mmol/L — AB (ref 3.5–5.1)
SODIUM: 137 mmol/L (ref 135–145)

## 2018-04-05 LAB — PHOSPHORUS: Phosphorus: 2.3 mg/dL — ABNORMAL LOW (ref 2.5–4.6)

## 2018-04-05 LAB — MAGNESIUM: MAGNESIUM: 1.9 mg/dL (ref 1.7–2.4)

## 2018-04-06 LAB — BASIC METABOLIC PANEL
ANION GAP: 12 (ref 5–15)
BUN: 12 mg/dL (ref 8–23)
CALCIUM: 9.2 mg/dL (ref 8.9–10.3)
CHLORIDE: 90 mmol/L — AB (ref 98–111)
CO2: 38 mmol/L — AB (ref 22–32)
Creatinine, Ser: 0.62 mg/dL (ref 0.44–1.00)
GFR calc Af Amer: >60 mL/min (ref >60)
GFR calc non Af Amer: >60 mL/min (ref >60)
GLUCOSE: 105 mg/dL — AB (ref 70–99)
Potassium: 3.3 mmol/L — ABNORMAL LOW (ref 3.5–5.1)
Sodium: 140 mmol/L (ref 135–145)

## 2018-04-06 LAB — PHOSPHORUS: Phosphorus: 2.3 mg/dL — ABNORMAL LOW (ref 2.5–4.6)

## 2018-04-06 LAB — MAGNESIUM: Magnesium: 2.1 mg/dL (ref 1.7–2.4)

## 2018-04-07 LAB — CBC
HCT: 33.9 % — ABNORMAL LOW (ref 36.0–46.0)
HEMOGLOBIN: 10.1 g/dL — AB (ref 12.0–15.0)
MCH: 29 pg (ref 26.0–34.0)
MCHC: 29.8 g/dL — ABNORMAL LOW (ref 30.0–36.0)
MCV: 97.4 fL (ref 78.0–100.0)
Platelets: 246 10*3/uL (ref 150–400)
RBC: 3.48 MIL/uL — AB (ref 3.87–5.11)
RDW: 20.6 % — ABNORMAL HIGH (ref 11.5–15.5)
WBC: 10.1 10*3/uL (ref 4.0–10.5)

## 2018-04-07 LAB — MAGNESIUM: MAGNESIUM: 2.1 mg/dL (ref 1.7–2.4)

## 2018-04-07 LAB — BASIC METABOLIC PANEL
Anion gap: 11 (ref 5–15)
BUN: 12 mg/dL (ref 8–23)
CHLORIDE: 92 mmol/L — AB (ref 98–111)
CO2: 38 mmol/L — ABNORMAL HIGH (ref 22–32)
Calcium: 9.3 mg/dL (ref 8.9–10.3)
Creatinine, Ser: 0.7 mg/dL (ref 0.44–1.00)
GFR calc Af Amer: >60 mL/min (ref >60)
GFR calc non Af Amer: >60 mL/min (ref >60)
GLUCOSE: 122 mg/dL — AB (ref 70–99)
POTASSIUM: 3.7 mmol/L (ref 3.5–5.1)
Sodium: 141 mmol/L (ref 135–145)

## 2018-04-07 LAB — PROTIME-INR
INR: 5.15 — AB
PROTHROMBIN TIME: 47.1 s — AB (ref 11.4–15.2)

## 2018-04-07 LAB — PHOSPHORUS: Phosphorus: 2.9 mg/dL (ref 2.5–4.6)

## 2018-04-08 LAB — PROTIME-INR
INR: 2.67
PROTHROMBIN TIME: 28.2 s — AB (ref 11.4–15.2)

## 2018-04-08 LAB — CBC
HCT: 31.5 % — ABNORMAL LOW (ref 36.0–46.0)
Hemoglobin: 9.3 g/dL — ABNORMAL LOW (ref 12.0–15.0)
MCH: 29 pg (ref 26.0–34.0)
MCHC: 29.5 g/dL — ABNORMAL LOW (ref 30.0–36.0)
MCV: 98.1 fL (ref 78.0–100.0)
PLATELETS: 196 10*3/uL (ref 150–400)
RBC: 3.21 MIL/uL — AB (ref 3.87–5.11)
RDW: 20.4 % — ABNORMAL HIGH (ref 11.5–15.5)
WBC: 7.7 10*3/uL (ref 4.0–10.5)

## 2018-04-09 LAB — CBC
HEMATOCRIT: 32.2 % — AB (ref 36.0–46.0)
HEMOGLOBIN: 9.5 g/dL — AB (ref 12.0–15.0)
MCH: 28.9 pg (ref 26.0–34.0)
MCHC: 29.5 g/dL — ABNORMAL LOW (ref 30.0–36.0)
MCV: 97.9 fL (ref 78.0–100.0)
Platelets: 202 10*3/uL (ref 150–400)
RBC: 3.29 MIL/uL — AB (ref 3.87–5.11)
RDW: 20.8 % — ABNORMAL HIGH (ref 11.5–15.5)
WBC: 9.5 10*3/uL (ref 4.0–10.5)

## 2018-04-09 LAB — PROTIME-INR
INR: 2.58
PROTHROMBIN TIME: 27.4 s — AB (ref 11.4–15.2)

## 2018-04-10 LAB — CBC
HCT: 32.1 % — ABNORMAL LOW (ref 36.0–46.0)
HEMOGLOBIN: 9.5 g/dL — AB (ref 12.0–15.0)
MCH: 28.9 pg (ref 26.0–34.0)
MCHC: 29.6 g/dL — AB (ref 30.0–36.0)
MCV: 97.6 fL (ref 78.0–100.0)
Platelets: 163 10*3/uL (ref 150–400)
RBC: 3.29 MIL/uL — ABNORMAL LOW (ref 3.87–5.11)
RDW: 20.7 % — ABNORMAL HIGH (ref 11.5–15.5)
WBC: 10.5 10*3/uL (ref 4.0–10.5)

## 2018-04-10 LAB — PHOSPHORUS: Phosphorus: 2.6 mg/dL (ref 2.5–4.6)

## 2018-04-10 LAB — PROTIME-INR
INR: 2.78
Prothrombin Time: 29.1 seconds — ABNORMAL HIGH (ref 11.4–15.2)

## 2018-04-10 LAB — BASIC METABOLIC PANEL
Anion gap: 11 (ref 5–15)
BUN: 11 mg/dL (ref 8–23)
CHLORIDE: 93 mmol/L — AB (ref 98–111)
CO2: 39 mmol/L — ABNORMAL HIGH (ref 22–32)
CREATININE: 0.58 mg/dL (ref 0.44–1.00)
Calcium: 8.7 mg/dL — ABNORMAL LOW (ref 8.9–10.3)
GFR calc Af Amer: >60 mL/min (ref >60)
GFR calc non Af Amer: >60 mL/min (ref >60)
Glucose, Bld: 112 mg/dL — ABNORMAL HIGH (ref 70–99)
Potassium: 2.8 mmol/L — ABNORMAL LOW (ref 3.5–5.1)
SODIUM: 143 mmol/L (ref 135–145)

## 2018-04-10 LAB — POTASSIUM: Potassium: 3.4 mmol/L — ABNORMAL LOW (ref 3.5–5.1)

## 2018-04-10 LAB — MAGNESIUM: Magnesium: 1.8 mg/dL (ref 1.7–2.4)

## 2018-04-11 ENCOUNTER — Other Ambulatory Visit (HOSPITAL_COMMUNITY): Payer: Medicare Other

## 2018-04-11 LAB — POTASSIUM: Potassium: 3.8 mmol/L (ref 3.5–5.1)

## 2018-04-13 ENCOUNTER — Other Ambulatory Visit (HOSPITAL_COMMUNITY): Payer: Medicare Other

## 2018-04-14 LAB — BASIC METABOLIC PANEL
ANION GAP: 10 (ref 5–15)
BUN: 12 mg/dL (ref 8–23)
CHLORIDE: 93 mmol/L — AB (ref 98–111)
CO2: 40 mmol/L — ABNORMAL HIGH (ref 22–32)
Calcium: 9 mg/dL (ref 8.9–10.3)
Creatinine, Ser: 0.56 mg/dL (ref 0.44–1.00)
GFR calc Af Amer: >60 mL/min (ref >60)
GFR calc non Af Amer: >60 mL/min (ref >60)
Glucose, Bld: 112 mg/dL — ABNORMAL HIGH (ref 70–99)
POTASSIUM: 3 mmol/L — AB (ref 3.5–5.1)
SODIUM: 143 mmol/L (ref 135–145)

## 2018-04-14 LAB — PROTIME-INR
INR: 3.79
PROTHROMBIN TIME: 37.1 s — AB (ref 11.4–15.2)

## 2018-04-14 LAB — CBC
HCT: 30.8 % — ABNORMAL LOW (ref 36.0–46.0)
HEMOGLOBIN: 9.4 g/dL — AB (ref 12.0–15.0)
MCH: 29.8 pg (ref 26.0–34.0)
MCHC: 30.5 g/dL (ref 30.0–36.0)
MCV: 97.8 fL (ref 78.0–100.0)
Platelets: 162 10*3/uL (ref 150–400)
RBC: 3.15 MIL/uL — AB (ref 3.87–5.11)
RDW: 20.9 % — ABNORMAL HIGH (ref 11.5–15.5)
WBC: 14.1 10*3/uL — ABNORMAL HIGH (ref 4.0–10.5)

## 2018-04-15 ENCOUNTER — Other Ambulatory Visit (HOSPITAL_COMMUNITY): Payer: Medicare Other

## 2018-04-15 LAB — BLOOD GAS, ARTERIAL
ACID-BASE EXCESS: 15.6 mmol/L — AB (ref 0.0–2.0)
Bicarbonate: 41.8 mmol/L — ABNORMAL HIGH (ref 20.0–28.0)
FIO2: 100
O2 SAT: 99.2 %
PCO2 ART: 76.4 mmHg — AB (ref 32.0–48.0)
PH ART: 7.357 (ref 7.350–7.450)
PO2 ART: 164 mmHg — AB (ref 83.0–108.0)
Patient temperature: 98.6

## 2018-04-15 LAB — TROPONIN I: Troponin I: 0.03 ng/mL (ref <0.03)

## 2018-04-15 LAB — CBC
HCT: 33.1 % — ABNORMAL LOW (ref 36.0–46.0)
Hemoglobin: 9.6 g/dL — ABNORMAL LOW (ref 12.0–15.0)
MCH: 28.8 pg (ref 26.0–34.0)
MCHC: 29 g/dL — ABNORMAL LOW (ref 30.0–36.0)
MCV: 99.4 fL (ref 78.0–100.0)
Platelets: 184 10*3/uL (ref 150–400)
RBC: 3.33 MIL/uL — AB (ref 3.87–5.11)
RDW: 21.1 % — ABNORMAL HIGH (ref 11.5–15.5)
WBC: 19.2 10*3/uL — AB (ref 4.0–10.5)

## 2018-04-15 LAB — BASIC METABOLIC PANEL
ANION GAP: 8 (ref 5–15)
BUN: 15 mg/dL (ref 8–23)
CALCIUM: 9 mg/dL (ref 8.9–10.3)
CO2: 40 mmol/L — ABNORMAL HIGH (ref 22–32)
Chloride: 93 mmol/L — ABNORMAL LOW (ref 98–111)
Creatinine, Ser: 0.66 mg/dL (ref 0.44–1.00)
GFR calc non Af Amer: >60 mL/min (ref >60)
GLUCOSE: 136 mg/dL — AB (ref 70–99)
POTASSIUM: 3.4 mmol/L — AB (ref 3.5–5.1)
SODIUM: 141 mmol/L (ref 135–145)

## 2018-04-15 LAB — MAGNESIUM: MAGNESIUM: 1.8 mg/dL (ref 1.7–2.4)

## 2018-04-15 LAB — POTASSIUM: POTASSIUM: 3.6 mmol/L (ref 3.5–5.1)

## 2018-04-16 LAB — CBC
HCT: 30.6 % — ABNORMAL LOW (ref 36.0–46.0)
Hemoglobin: 9 g/dL — ABNORMAL LOW (ref 12.0–15.0)
MCH: 29.5 pg (ref 26.0–34.0)
MCHC: 29.4 g/dL — AB (ref 30.0–36.0)
MCV: 100.3 fL — AB (ref 78.0–100.0)
PLATELETS: 179 10*3/uL (ref 150–400)
RBC: 3.05 MIL/uL — ABNORMAL LOW (ref 3.87–5.11)
RDW: 22.1 % — AB (ref 11.5–15.5)
WBC: 18.5 10*3/uL — ABNORMAL HIGH (ref 4.0–10.5)

## 2018-04-16 LAB — BLOOD GAS, ARTERIAL
ACID-BASE EXCESS: 14.1 mmol/L — AB (ref 0.0–2.0)
ACID-BASE EXCESS: 12.8 mmol/L — AB (ref 0.0–2.0)
BICARBONATE: 39.2 mmol/L — AB (ref 20.0–28.0)
Bicarbonate: 38.1 mmol/L — ABNORMAL HIGH (ref 20.0–28.0)
FIO2: 50
O2 Content: 10 L/min
O2 SAT: 89.6 %
O2 SAT: 99 %
PATIENT TEMPERATURE: 98.8
PCO2 ART: 59.5 mmHg — AB (ref 32.0–48.0)
PH ART: 7.434 (ref 7.350–7.450)
PH ART: 7.408 (ref 7.350–7.450)
PO2 ART: 55.9 mmHg — AB (ref 83.0–108.0)
PO2 ART: 118 mmHg — AB (ref 83.0–108.0)
Patient temperature: 98
pCO2 arterial: 61.3 mmHg — ABNORMAL HIGH (ref 32.0–48.0)

## 2018-04-16 LAB — POTASSIUM: POTASSIUM: 4 mmol/L (ref 3.5–5.1)

## 2018-04-16 LAB — BASIC METABOLIC PANEL
Anion gap: 8 (ref 5–15)
BUN: 16 mg/dL (ref 8–23)
CO2: 36 mmol/L — ABNORMAL HIGH (ref 22–32)
CREATININE: 0.67 mg/dL (ref 0.44–1.00)
Calcium: 8.5 mg/dL — ABNORMAL LOW (ref 8.9–10.3)
Chloride: 96 mmol/L — ABNORMAL LOW (ref 98–111)
GFR calc Af Amer: >60 mL/min (ref >60)
GLUCOSE: 338 mg/dL — AB (ref 70–99)
Potassium: 3.8 mmol/L (ref 3.5–5.1)
SODIUM: 140 mmol/L (ref 135–145)

## 2018-04-16 LAB — URINALYSIS, ROUTINE W REFLEX MICROSCOPIC
Bilirubin Urine: NEGATIVE
Glucose, UA: NEGATIVE mg/dL
Hgb urine dipstick: NEGATIVE
Ketones, ur: NEGATIVE mg/dL
Leukocytes, UA: NEGATIVE
Nitrite: NEGATIVE
Protein, ur: NEGATIVE mg/dL
Specific Gravity, Urine: 1.014 (ref 1.005–1.030)
pH: 5 (ref 5.0–8.0)

## 2018-04-16 LAB — LACTIC ACID, PLASMA: Lactic Acid, Venous: 1.6 mmol/L (ref 0.5–1.9)

## 2018-04-16 LAB — MAGNESIUM
MAGNESIUM: 1.8 mg/dL (ref 1.7–2.4)
Magnesium: 2 mg/dL (ref 1.7–2.4)

## 2018-04-17 ENCOUNTER — Other Ambulatory Visit (HOSPITAL_COMMUNITY): Payer: Medicare Other

## 2018-04-17 LAB — CBC
HEMATOCRIT: 32.8 % — AB (ref 36.0–46.0)
Hemoglobin: 9.5 g/dL — ABNORMAL LOW (ref 12.0–15.0)
MCH: 29.7 pg (ref 26.0–34.0)
MCHC: 29 g/dL — AB (ref 30.0–36.0)
MCV: 102.5 fL — ABNORMAL HIGH (ref 78.0–100.0)
Platelets: 183 10*3/uL (ref 150–400)
RBC: 3.2 MIL/uL — AB (ref 3.87–5.11)
RDW: 22.2 % — ABNORMAL HIGH (ref 11.5–15.5)
WBC: 16 10*3/uL — ABNORMAL HIGH (ref 4.0–10.5)

## 2018-04-17 LAB — URINE CULTURE: Culture: NO GROWTH

## 2018-04-18 ENCOUNTER — Other Ambulatory Visit (HOSPITAL_COMMUNITY): Payer: Medicare Other

## 2018-04-19 ENCOUNTER — Other Ambulatory Visit (HOSPITAL_COMMUNITY): Payer: Medicare Other

## 2018-04-19 LAB — BASIC METABOLIC PANEL
Anion gap: 8 (ref 5–15)
BUN: 44 mg/dL — ABNORMAL HIGH (ref 8–23)
CALCIUM: 9.1 mg/dL (ref 8.9–10.3)
CO2: 31 mmol/L (ref 22–32)
CREATININE: 1.2 mg/dL — AB (ref 0.44–1.00)
Chloride: 104 mmol/L (ref 98–111)
GFR calc non Af Amer: 43 mL/min — ABNORMAL LOW (ref >60)
GFR, EST AFRICAN AMERICAN: 50 mL/min — AB (ref >60)
Glucose, Bld: 141 mg/dL — ABNORMAL HIGH (ref 70–99)
Potassium: 4.3 mmol/L (ref 3.5–5.1)
Sodium: 143 mmol/L (ref 135–145)

## 2018-04-19 LAB — PROTIME-INR
INR: >10
Prothrombin Time: 88.1 seconds — ABNORMAL HIGH (ref 11.4–15.2)

## 2018-04-19 LAB — CBC
HCT: 36.8 % (ref 36.0–46.0)
Hemoglobin: 10.1 g/dL — ABNORMAL LOW (ref 12.0–15.0)
MCH: 29.4 pg (ref 26.0–34.0)
MCHC: 27.4 g/dL — AB (ref 30.0–36.0)
MCV: 107 fL — ABNORMAL HIGH (ref 78.0–100.0)
Platelets: 187 10*3/uL (ref 150–400)
RBC: 3.44 MIL/uL — ABNORMAL LOW (ref 3.87–5.11)
RDW: 22.5 % — AB (ref 11.5–15.5)
WBC: 17.5 10*3/uL — ABNORMAL HIGH (ref 4.0–10.5)

## 2018-04-19 LAB — MAGNESIUM: Magnesium: 2.1 mg/dL (ref 1.7–2.4)

## 2018-04-19 LAB — PHOSPHORUS: Phosphorus: 4.8 mg/dL — ABNORMAL HIGH (ref 2.5–4.6)

## 2018-04-19 LAB — VANCOMYCIN, TROUGH: Vancomycin Tr: 28 ug/mL (ref 15–20)

## 2018-04-20 LAB — CBC
HEMATOCRIT: 31 % — AB (ref 36.0–46.0)
HEMOGLOBIN: 8.6 g/dL — AB (ref 12.0–15.0)
MCH: 29.8 pg (ref 26.0–34.0)
MCHC: 27.7 g/dL — ABNORMAL LOW (ref 30.0–36.0)
MCV: 107.3 fL — ABNORMAL HIGH (ref 78.0–100.0)
Platelets: 146 10*3/uL — ABNORMAL LOW (ref 150–400)
RBC: 2.89 MIL/uL — AB (ref 3.87–5.11)
RDW: 22.2 % — ABNORMAL HIGH (ref 11.5–15.5)
WBC: 15.5 10*3/uL — AB (ref 4.0–10.5)

## 2018-04-20 LAB — RENAL FUNCTION PANEL
ALBUMIN: 1.8 g/dL — AB (ref 3.5–5.0)
ANION GAP: 6 (ref 5–15)
BUN: 61 mg/dL — ABNORMAL HIGH (ref 8–23)
CALCIUM: 9.1 mg/dL (ref 8.9–10.3)
CO2: 36 mmol/L — ABNORMAL HIGH (ref 22–32)
Chloride: 103 mmol/L (ref 98–111)
Creatinine, Ser: 1.55 mg/dL — ABNORMAL HIGH (ref 0.44–1.00)
GFR, EST AFRICAN AMERICAN: 37 mL/min — AB (ref >60)
GFR, EST NON AFRICAN AMERICAN: 32 mL/min — AB (ref >60)
GLUCOSE: 159 mg/dL — AB (ref 70–99)
PHOSPHORUS: 4 mg/dL (ref 2.5–4.6)
POTASSIUM: 3.9 mmol/L (ref 3.5–5.1)
SODIUM: 145 mmol/L (ref 135–145)

## 2018-04-20 LAB — PROTIME-INR
INR: 8.17 — AB
PROTHROMBIN TIME: 67.6 s — AB (ref 11.4–15.2)

## 2018-04-20 LAB — MAGNESIUM: MAGNESIUM: 2.1 mg/dL (ref 1.7–2.4)

## 2018-04-21 ENCOUNTER — Other Ambulatory Visit (HOSPITAL_COMMUNITY): Payer: Medicare Other

## 2018-04-21 LAB — CULTURE, BLOOD (ROUTINE X 2)
CULTURE: NO GROWTH
CULTURE: NO GROWTH
SPECIAL REQUESTS: ADEQUATE

## 2018-04-21 LAB — RENAL FUNCTION PANEL
ALBUMIN: 1.8 g/dL — AB (ref 3.5–5.0)
ANION GAP: 5 (ref 5–15)
BUN: 78 mg/dL — ABNORMAL HIGH (ref 8–23)
CALCIUM: 9 mg/dL (ref 8.9–10.3)
CO2: 35 mmol/L — ABNORMAL HIGH (ref 22–32)
Chloride: 105 mmol/L (ref 98–111)
Creatinine, Ser: 2.06 mg/dL — ABNORMAL HIGH (ref 0.44–1.00)
GFR calc non Af Amer: 23 mL/min — ABNORMAL LOW (ref >60)
GFR, EST AFRICAN AMERICAN: 26 mL/min — AB (ref >60)
GLUCOSE: 159 mg/dL — AB (ref 70–99)
PHOSPHORUS: 3.9 mg/dL (ref 2.5–4.6)
POTASSIUM: 4.4 mmol/L (ref 3.5–5.1)
SODIUM: 145 mmol/L (ref 135–145)

## 2018-04-21 LAB — CBC
HEMATOCRIT: 27.5 % — AB (ref 36.0–46.0)
Hemoglobin: 7.6 g/dL — ABNORMAL LOW (ref 12.0–15.0)
MCH: 29.5 pg (ref 26.0–34.0)
MCHC: 27.6 g/dL — AB (ref 30.0–36.0)
MCV: 106.6 fL — ABNORMAL HIGH (ref 78.0–100.0)
Platelets: 108 10*3/uL — ABNORMAL LOW (ref 150–400)
RBC: 2.58 MIL/uL — ABNORMAL LOW (ref 3.87–5.11)
RDW: 22.5 % — ABNORMAL HIGH (ref 11.5–15.5)
WBC: 16.4 10*3/uL — ABNORMAL HIGH (ref 4.0–10.5)

## 2018-04-21 LAB — VANCOMYCIN, TROUGH: Vancomycin Tr: 23 ug/mL (ref 15–20)

## 2018-04-21 LAB — MAGNESIUM: MAGNESIUM: 2.2 mg/dL (ref 1.7–2.4)

## 2018-04-21 LAB — PROTIME-INR
INR: 3.44
PROTHROMBIN TIME: 34.4 s — AB (ref 11.4–15.2)

## 2018-04-30 DEATH — deceased

## 2018-05-04 ENCOUNTER — Ambulatory Visit: Payer: Medicare Other | Admitting: Cardiovascular Disease

## 2020-03-08 IMAGING — DX DG CHEST 2V
2 series · 2 of 2 positions shown · non-contrast
Comparison: None in PACs

CLINICAL DATA: Shortness of breath. History of CHF, atrial
fibrillation, coronary artery disease, and previous CVA.

EXAM:
CHEST - 2 VIEW

[chest lat]
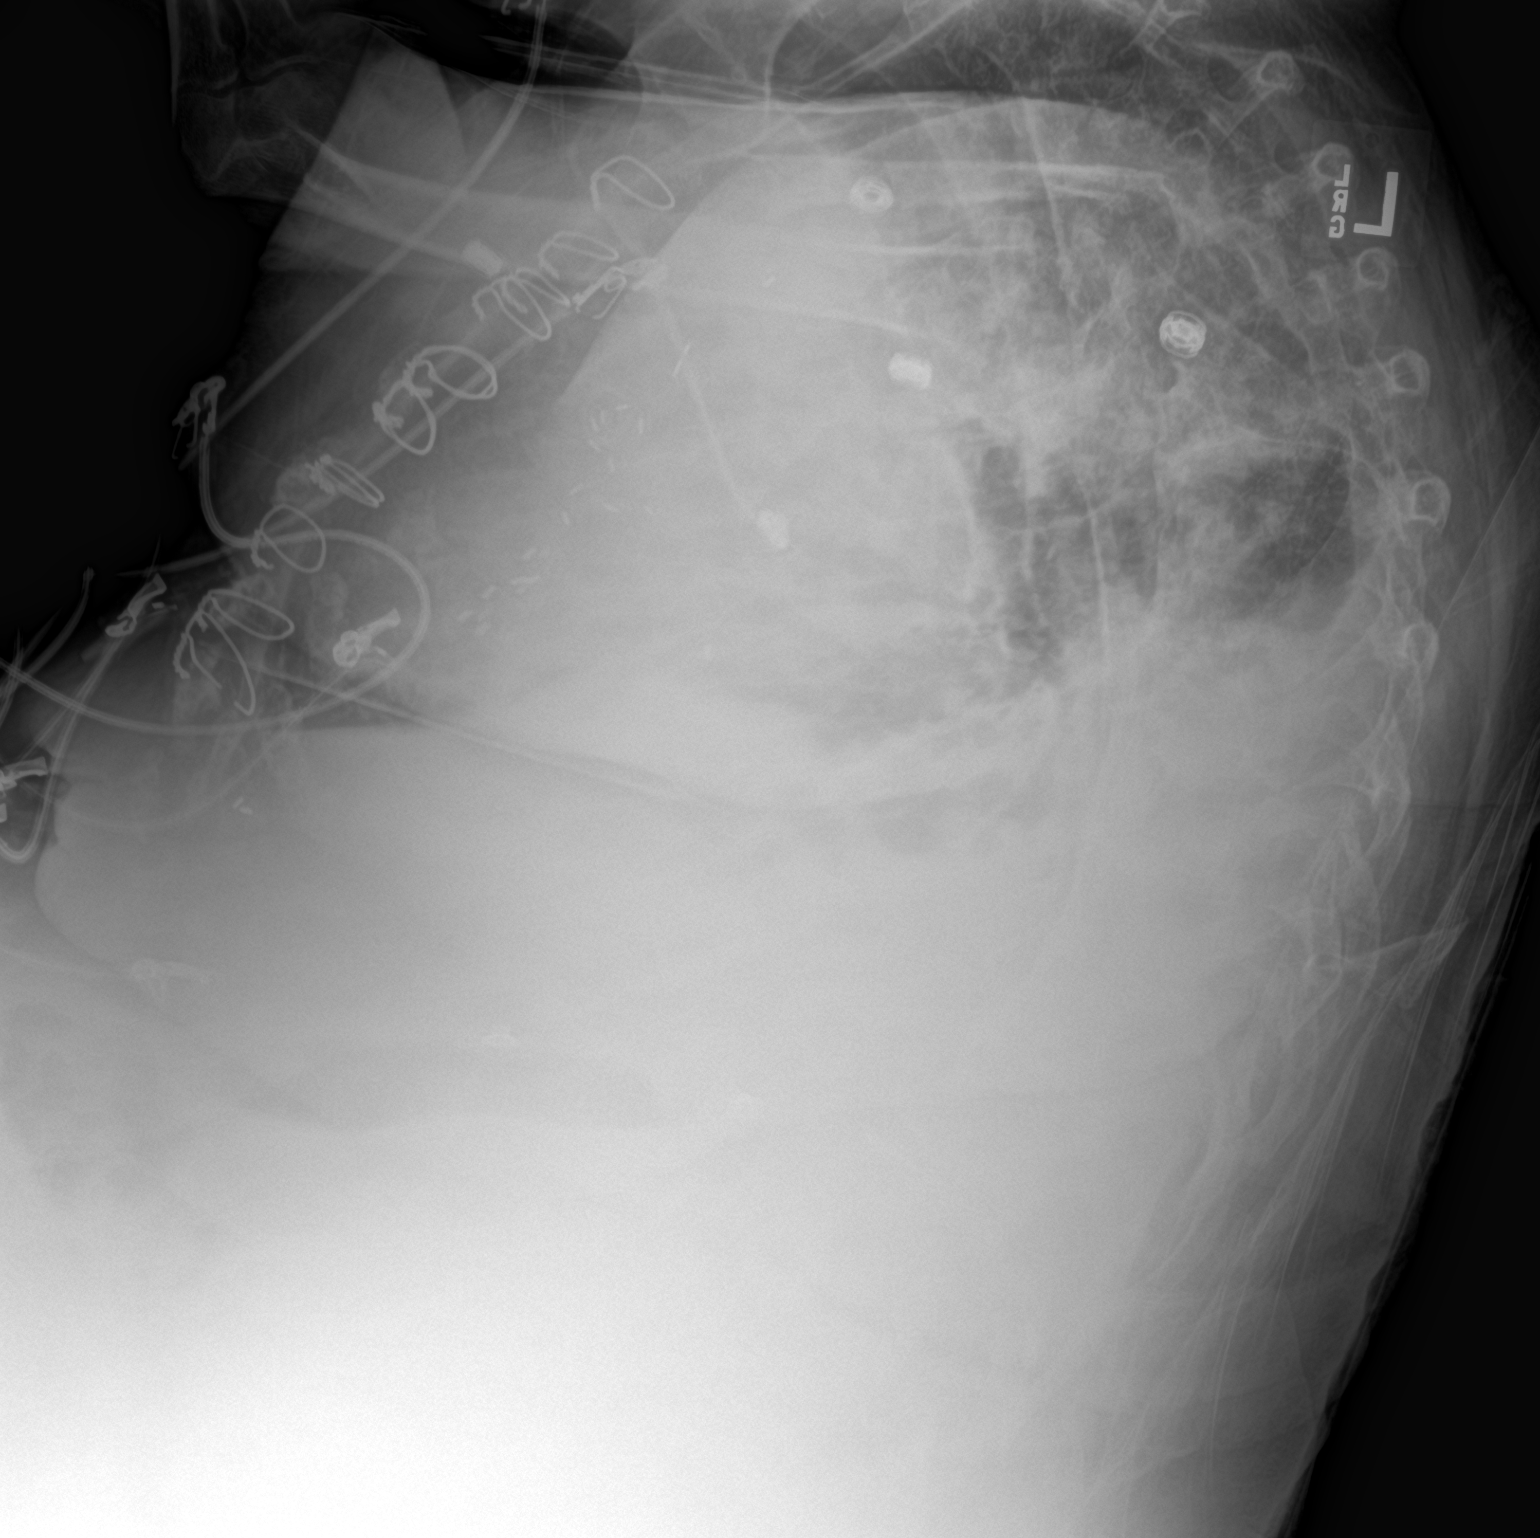

[chest ap]
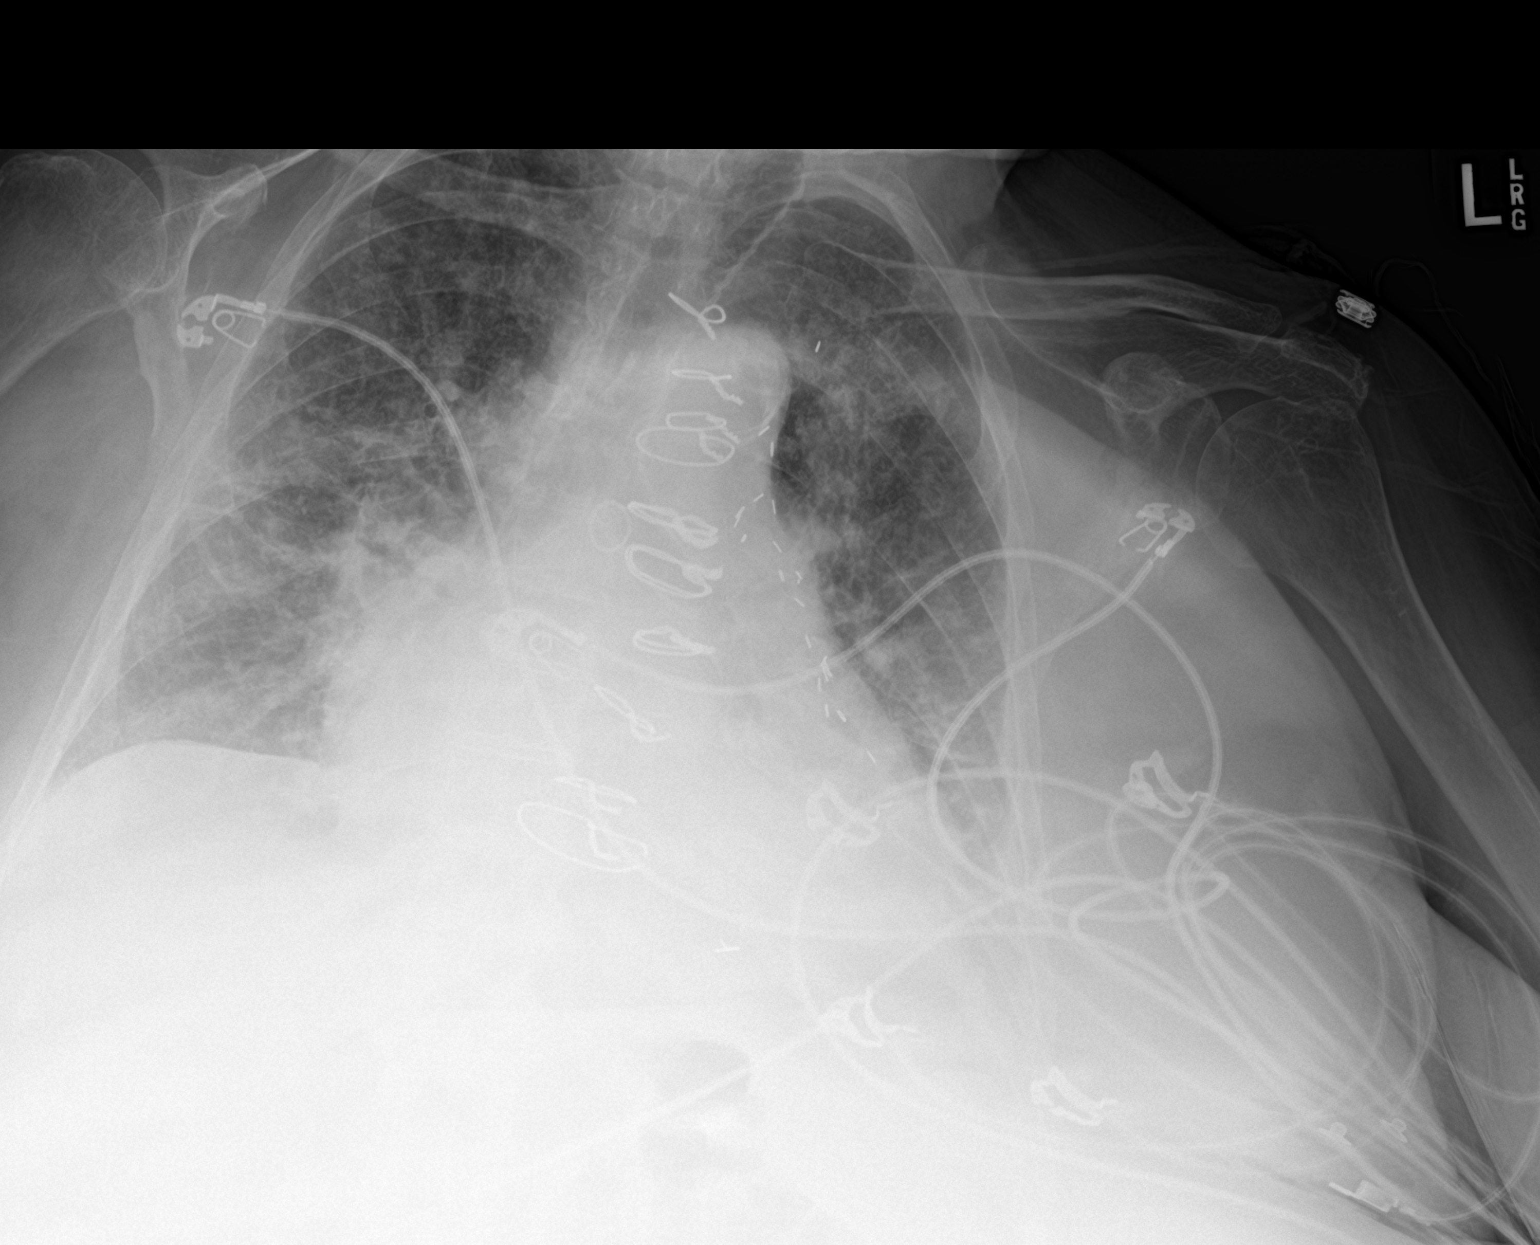

[2 of 2 positions shown; findings below may reference images not displayed]

FINDINGS: The lungs are adequately inflated. The interstitial markings are
increased. The cardiac silhouette is enlarged. The pulmonary
vascularity is engorged. There are bilateral pleural effusions
layering posteriorly. The patient has undergone previous CABG. The
observed bony thorax is unremarkable.
IMPRESSION: Findings likely reflect CHF. Bilateral pleural effusions layering
posteriorly. One cannot exclude atelectasis or pneumonia in the left
lower lobe.

Previous CABG.  Thoracic aortic atherosclerosis.

## 2020-03-08 IMAGING — DX DG FOOT COMPLETE 3+V*R*
3 series · 4 of 4 positions shown · non-contrast
Comparison: None.

CLINICAL DATA: Fractures

EXAM:
RIGHT FOOT COMPLETE - 3+ VIEW

[Series 1: foot ap · 0.14mm/px · 2 of 2 slices shown]
[im 1/2]
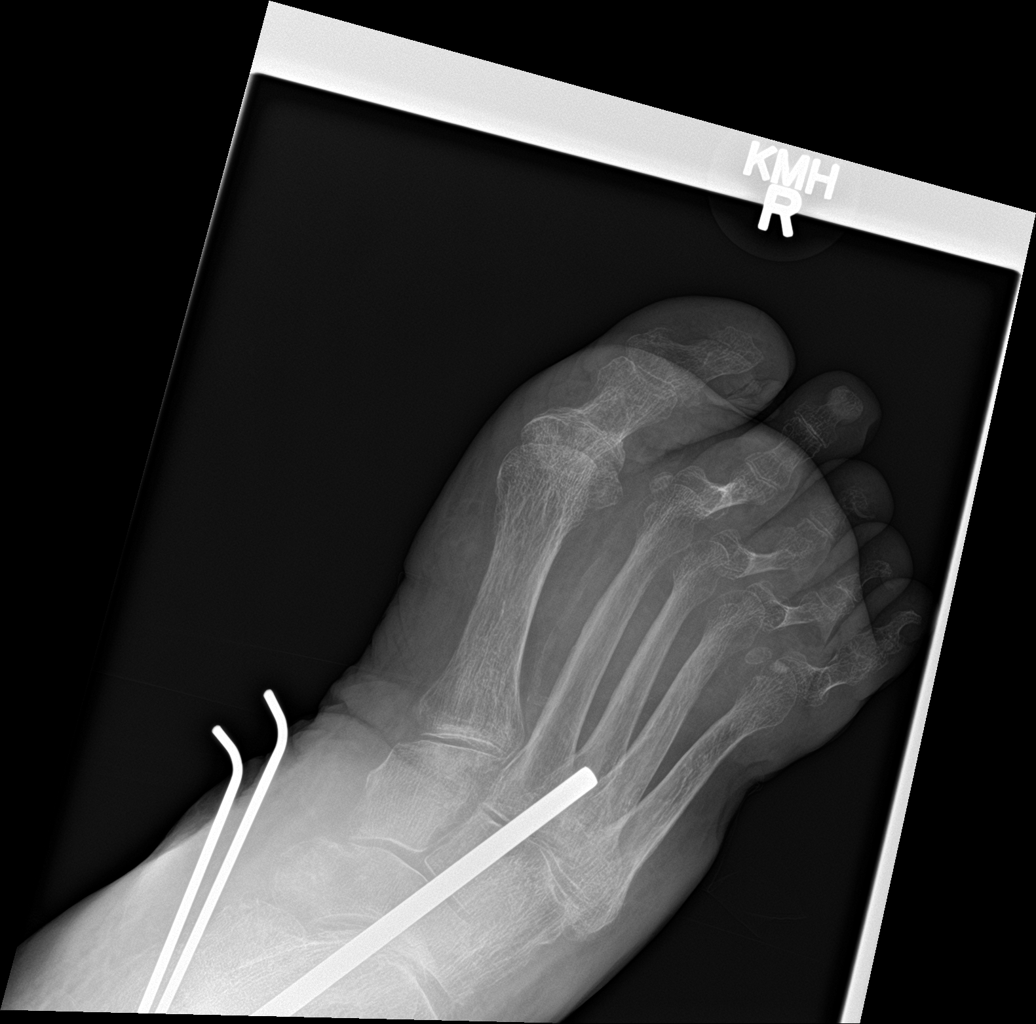
[im 2/2]
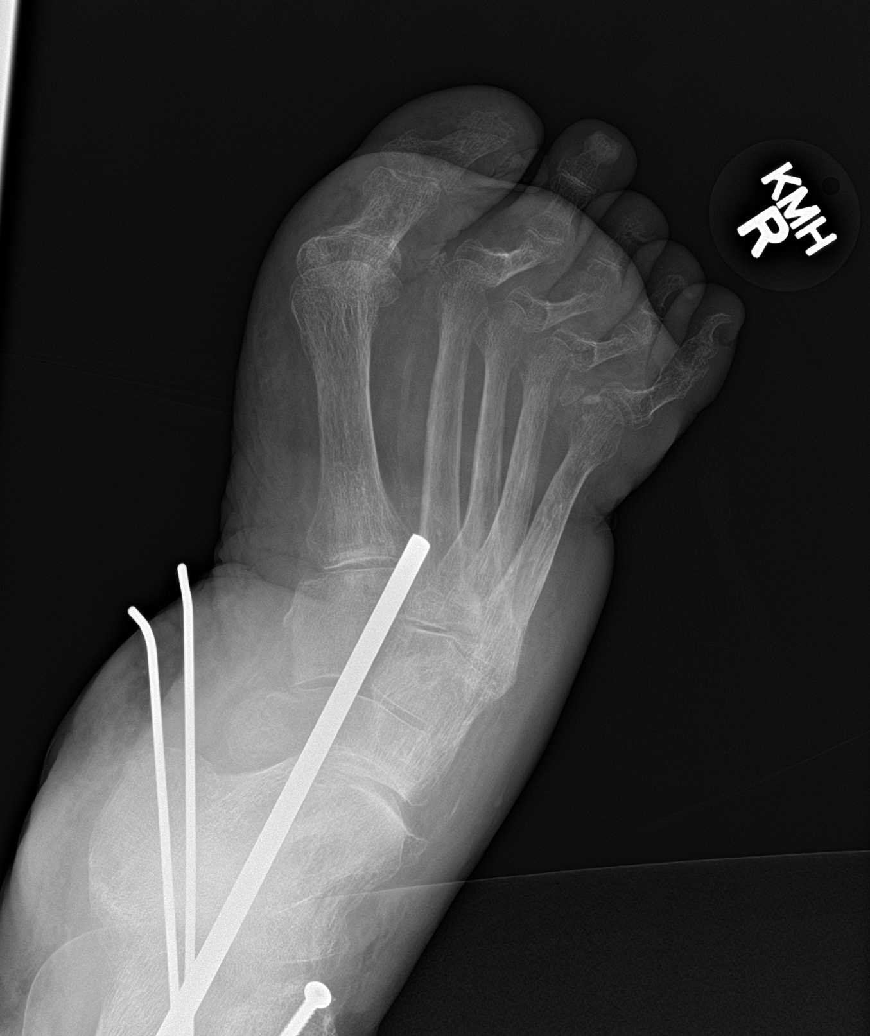

[foot obl]
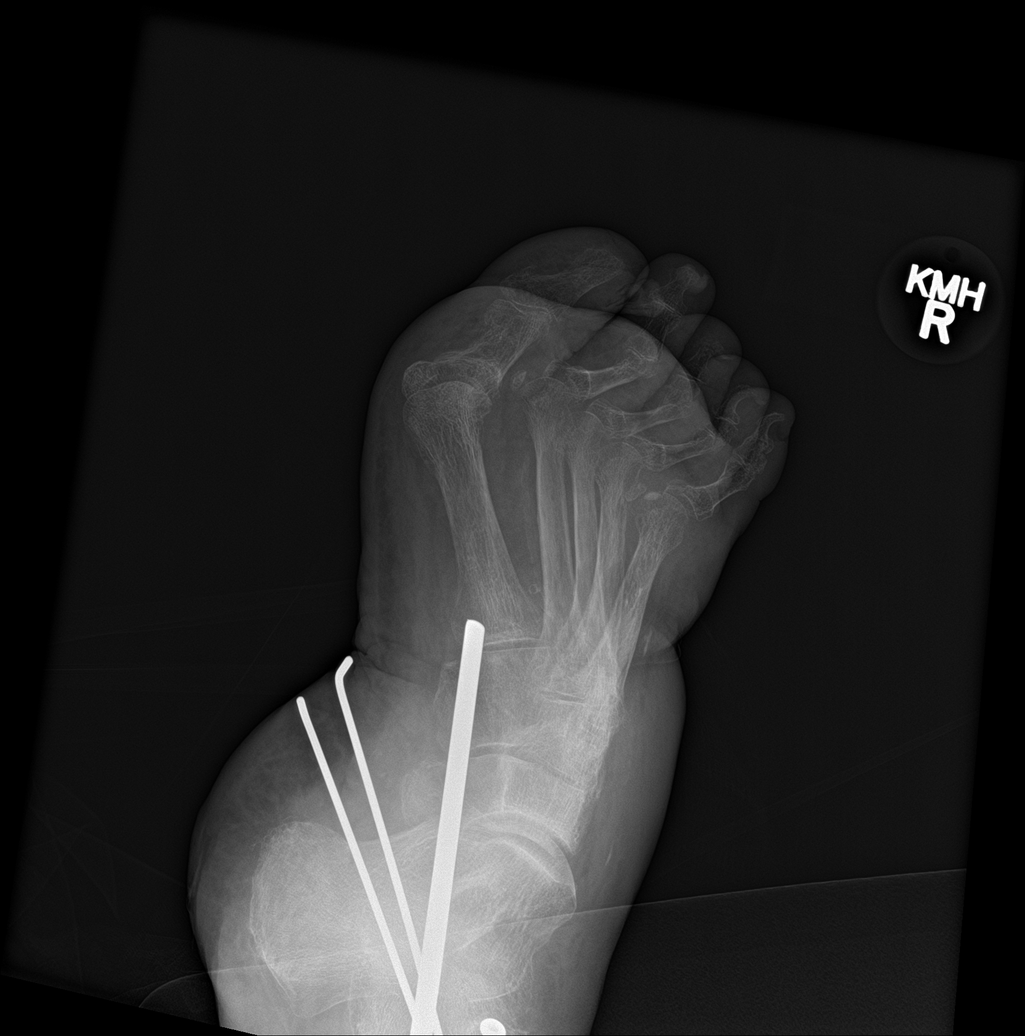

[foot lat]
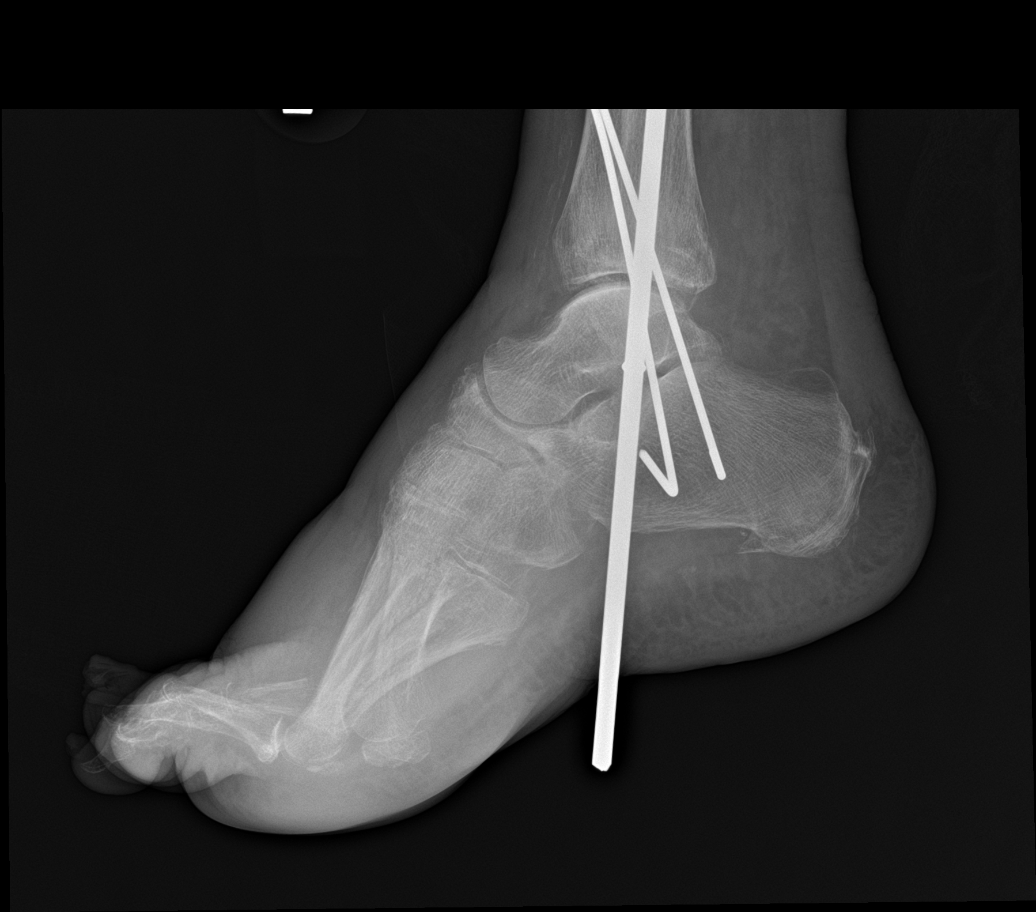

[4 of 4 positions shown; findings below may reference images not displayed]

FINDINGS: No prior radiographs available for comparison. Osteopenia limits the
exam. There appear to be mildly displaced fractures at the neck of
the second proximal phalanx, head of the third proximal phalanx, and
neck of the fourth proximal phalanx. Possible nondisplaced fracture
neck of the fifth proximal phalanx. Dorsiflexion at the MCP joints.
Possible dorsal subluxation of the base of either the fourth or
fifth proximal phalanx with respect to the head of the metacarpal.
IMPRESSION: 1. Osteopenia limits the exam. Suspected mildly displaced fractures
involving the distal aspects of the second through fifth proximal
phalanges. Appearance of possible dorsal subluxation of either the
fourth or fifth proximal phalanx with respect to the head of the
metatarsal.
